# Patient Record
Sex: Female | Born: 1970 | Race: White | Hispanic: No | Marital: Married | Smoking: Former smoker
Health system: Southern US, Community
[De-identification: ages and names within clinical notes are randomized; demographics above are authoritative.]

## PROBLEM LIST (undated history)

## (undated) DIAGNOSIS — N920 Excessive and frequent menstruation with regular cycle: Secondary | ICD-10-CM

## (undated) DIAGNOSIS — T4145XA Adverse effect of unspecified anesthetic, initial encounter: Secondary | ICD-10-CM

## (undated) DIAGNOSIS — Z9889 Other specified postprocedural states: Secondary | ICD-10-CM

## (undated) DIAGNOSIS — Z319 Encounter for procreative management, unspecified: Secondary | ICD-10-CM

## (undated) DIAGNOSIS — I959 Hypotension, unspecified: Secondary | ICD-10-CM

## (undated) DIAGNOSIS — Z8739 Personal history of other diseases of the musculoskeletal system and connective tissue: Secondary | ICD-10-CM

## (undated) DIAGNOSIS — R112 Nausea with vomiting, unspecified: Secondary | ICD-10-CM

## (undated) DIAGNOSIS — I7300 Raynaud's syndrome without gangrene: Secondary | ICD-10-CM

## (undated) DIAGNOSIS — T8859XA Other complications of anesthesia, initial encounter: Secondary | ICD-10-CM

## (undated) DIAGNOSIS — R519 Headache, unspecified: Secondary | ICD-10-CM

## (undated) DIAGNOSIS — N949 Unspecified condition associated with female genital organs and menstrual cycle: Secondary | ICD-10-CM

## (undated) DIAGNOSIS — R51 Headache: Secondary | ICD-10-CM

## (undated) HISTORY — PX: ELBOW ARTHROPLASTY: SHX928

## (undated) HISTORY — DX: Encounter for procreative management, unspecified: Z31.9

## (undated) HISTORY — DX: Personal history of other diseases of the musculoskeletal system and connective tissue: Z87.39

## (undated) HISTORY — DX: Raynaud's syndrome without gangrene: I73.00

## (undated) HISTORY — DX: Hypotension, unspecified: I95.9

## (undated) HISTORY — PX: MANDIBLE FRACTURE SURGERY: SHX706

## (undated) HISTORY — DX: Excessive and frequent menstruation with regular cycle: N92.0

## (undated) HISTORY — PX: RECTOCELE REPAIR: SHX761

## (undated) HISTORY — PX: TUBAL LIGATION: SHX77

## (undated) HISTORY — DX: Unspecified condition associated with female genital organs and menstrual cycle: N94.9

---

## 2004-02-18 ENCOUNTER — Ambulatory Visit (HOSPITAL_COMMUNITY): Admission: RE | Admit: 2004-02-18 | Discharge: 2004-02-18 | Payer: Self-pay | Admitting: Obstetrics and Gynecology

## 2005-12-15 ENCOUNTER — Ambulatory Visit (HOSPITAL_COMMUNITY): Admission: RE | Admit: 2005-12-15 | Discharge: 2005-12-15 | Payer: Self-pay | Admitting: Family Medicine

## 2006-10-18 ENCOUNTER — Other Ambulatory Visit: Admission: RE | Admit: 2006-10-18 | Discharge: 2006-10-18 | Payer: Self-pay | Admitting: Obstetrics and Gynecology

## 2006-11-10 ENCOUNTER — Other Ambulatory Visit: Admission: RE | Admit: 2006-11-10 | Discharge: 2006-11-10 | Payer: Self-pay | Admitting: Obstetrics and Gynecology

## 2006-11-20 ENCOUNTER — Emergency Department (HOSPITAL_COMMUNITY): Admission: EM | Admit: 2006-11-20 | Discharge: 2006-11-20 | Payer: Self-pay | Admitting: Emergency Medicine

## 2008-03-27 ENCOUNTER — Emergency Department (HOSPITAL_COMMUNITY): Admission: EM | Admit: 2008-03-27 | Discharge: 2008-03-27 | Payer: Self-pay | Admitting: Emergency Medicine

## 2008-04-06 ENCOUNTER — Other Ambulatory Visit: Admission: RE | Admit: 2008-04-06 | Discharge: 2008-04-06 | Payer: Self-pay | Admitting: Obstetrics and Gynecology

## 2008-07-24 ENCOUNTER — Ambulatory Visit (HOSPITAL_BASED_OUTPATIENT_CLINIC_OR_DEPARTMENT_OTHER): Admission: RE | Admit: 2008-07-24 | Discharge: 2008-07-24 | Payer: Self-pay | Admitting: Orthopedic Surgery

## 2009-07-09 ENCOUNTER — Other Ambulatory Visit: Admission: RE | Admit: 2009-07-09 | Discharge: 2009-07-09 | Payer: Self-pay | Admitting: Obstetrics and Gynecology

## 2010-02-22 ENCOUNTER — Encounter: Payer: Self-pay | Admitting: Obstetrics and Gynecology

## 2010-02-23 ENCOUNTER — Encounter: Payer: Self-pay | Admitting: Family Medicine

## 2010-02-23 ENCOUNTER — Encounter: Payer: Self-pay | Admitting: Neurosurgery

## 2010-05-12 LAB — POCT HEMOGLOBIN-HEMACUE: Hemoglobin: 13.2 g/dL (ref 12.0–15.0)

## 2010-06-17 NOTE — Op Note (Signed)
NAMESABA, GOMM                ACCOUNT NO.:  [id number]   MEDICAL RECORD NO.:  [id number]          PATIENT TYPE:  AMB   LOCATION:  DSC                          FACILITY:  [facility]   PHYSICIAN:  Katy Fitch. Sypher, M.D. 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   OPERATIONS:  1. Debridement of necrotic extensor carpi radialis brevis tendon from      right lateral epicondyle.  2. Reconstruction of extensor carpi radialis longus and brevis origin      at lateral right elbow with through bone sutures following drilling      and decortication of lateral epicondyle.   OPERATING SURGEON:  Katy Fitch. Sypher, MD   ASSISTANT:  Marveen Reeks Dasnoit, PA-C   ANESTHESIA:  General by LMA.   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   She had sustained an injury to her elbow in February 2010.  She had  chronic elbow pain, unresponsive to anti-inflammatory medication and  prior injection.  She was referred for an upper extremity orthopedic  consult for chronic pain.  We recommended proceeding with an MRI to  determine whether or not she had cavitary tendinopathy.   The MRI revealed extensive lateral epicondylitis changes and cavity in  the extensor carpi radialis brevis.  Her lateral collateral ligament was  intact.  She also reported some tenderness in the mobile wide consistent  with possible radial tunnel syndrome.   In our experience, frequently there are signs of radial tunnel syndrome  when there is chronic  tendinopathy of the extensor carpi radialis  brevis.   We recommended proceeding with repair of the extensor carpi radialis  brevis and longus tendon origins prior to considering any treatment of  radial tunnel syndrome.   After informed consent, Mary Gilmore is brought to the operating room at  this time anticipating reconstruction of her right elbow common extensor  origin.   PROCEDURE:  Perrin Eddleman was brought to room #1 of [facility]  and placed in supine position on the operating table.   Under Dr. Michaelle Copas direct supervision, general anesthesia by LMA  technique was induced.  The right arm was prepped with Betadine soap and  solution, sterilely draped.  A 1 g of Ancef was administered as an IV  prophylactic antibiotic.  The right upper extremity was then  exsanguinated with an Esmarch bandage and the arterial tourniquet  inflated to 230 mmHg.  The procedure commenced with a short curvilinear  incision posterior to the epicondyle and posterior to the extensor carpi  radialis longus.  The interval between the anconeus and extensor carpi  radialis longus was sharply incised, the fascia elevated, and  the  tendinous origin inspected.  There was swelling of the extensor carpi  radialis longus and brevis.  The normal-appearing surface of the  extensor carpi radialis longus was incised and its fibers elevated.  The  extensor carpi radialis brevis origin deep and anterior was cavitary,  edematous, and had areas of tendon necrosis.  There was an area of  notching of the adjacent capitellum with synovitis, which could be  reactive due to prior injection or could simply be due to chronic  inflammation at the site of tendon injury.   The synovitis and area of necrotic tendon were debrided.  The extensor  carpi radialis longus and brevis were released at the level of the  brachioradialis.  The lateral condyle was then decorticated with a  rongeur and drilled approximately 30 times  with a 0.035-inch Kirschner  wire.  Mattress sutures were placed with through bone drill holes using  3-0 FiberWire.  This created anatomic inset of the extensor carpi  radialis longus and brevis tendons to the lateral condyle.  We used the  tails of the suture with over-the-top technique to pull the periosteum  laterally to meet the tendon followed by running suture finishing the  repair.  The fascia was then repaired with a running suture of 3-0  Vicryl.  An anatomic reconstruction of the tendon origin was achieved.   The wound was then repaired with intradermal 3-0 Prolene and Steri-  Strips.  A 2% lidocaine was infiltrated for postop analgesia.  Mary Gilmore  was placed in a compressive dressing at the elbow with sterile gauze and  Tegaderm followed by Ace wrap.  The wrist was immobilized in 40 degrees  of dorsiflexion with a volar plaster splint and Ace wrap.  There were no  apparent complications.      Katy Fitch Sypher, M.D.  Electronically Signed     RVS/MEDQ  D:  07/24/2008  T:  07/25/2008  Job:  [id number]

## 2010-09-16 ENCOUNTER — Other Ambulatory Visit (HOSPITAL_COMMUNITY)
Admission: RE | Admit: 2010-09-16 | Discharge: 2010-09-16 | Disposition: A | Discharge status: Home / Self Care | Payer: BC Managed Care – HMO | Source: Ambulatory Visit | Attending: Obstetrics and Gynecology | Admitting: Obstetrics and Gynecology

## 2010-09-16 DIAGNOSIS — Z01419 Encounter for gynecological examination (general) (routine) without abnormal findings: Secondary | ICD-10-CM | POA: Insufficient documentation

## 2010-11-12 LAB — URINE CULTURE
Colony Count: NO GROWTH
Culture: NO GROWTH

## 2010-11-12 LAB — COMPREHENSIVE METABOLIC PANEL
ALT: 17
AST: 20
Albumin: 3.8
Alkaline Phosphatase: 60
BUN: 5 — ABNORMAL LOW
CO2: 25
Calcium: 9
Chloride: 105
Creatinine, Ser: 0.83
GFR calc Af Amer: >60
GFR calc non Af Amer: >60
Glucose, Bld: 98
Potassium: 3.7
Sodium: 136
Total Bilirubin: 0.8
Total Protein: 6.5

## 2010-11-12 LAB — URINALYSIS, ROUTINE W REFLEX MICROSCOPIC
Bilirubin Urine: NEGATIVE
Glucose, UA: NEGATIVE
Hgb urine dipstick: NEGATIVE
Ketones, ur: NEGATIVE
Nitrite: NEGATIVE
Protein, ur: NEGATIVE
Specific Gravity, Urine: <1.005 — ABNORMAL LOW
Urobilinogen, UA: 0.2
pH: 6

## 2010-11-12 LAB — CBC
HCT: 38.7
Hemoglobin: 13.1
MCHC: 33.9
MCV: 95.7
Platelets: 211
RBC: 4.04
RDW: 13.1
WBC: 12.1 — ABNORMAL HIGH

## 2010-11-12 LAB — DIFFERENTIAL
Basophils Absolute: 0
Basophils Relative: 0
Eosinophils Absolute: 0.3
Eosinophils Relative: 2
Lymphocytes Relative: 22
Lymphs Abs: 2.7
Monocytes Absolute: 1.1 — ABNORMAL HIGH
Monocytes Relative: 9
Neutro Abs: 8 — ABNORMAL HIGH
Neutrophils Relative %: 66

## 2012-06-06 HISTORY — PX: OTHER SURGICAL HISTORY: SHX169

## 2012-07-01 ENCOUNTER — Ambulatory Visit (INDEPENDENT_AMBULATORY_CARE_PROVIDER_SITE_OTHER): Payer: BC Managed Care – PPO | Admitting: Adult Health

## 2012-07-01 ENCOUNTER — Encounter: Payer: Self-pay | Admitting: Adult Health

## 2012-07-01 VITALS — BP 108/64 | Ht 62.5 in | Wt 156.0 lb

## 2012-07-01 DIAGNOSIS — N949 Unspecified condition associated with female genital organs and menstrual cycle: Secondary | ICD-10-CM

## 2012-07-01 DIAGNOSIS — N898 Other specified noninflammatory disorders of vagina: Secondary | ICD-10-CM

## 2012-07-01 HISTORY — DX: Unspecified condition associated with female genital organs and menstrual cycle: N94.9

## 2012-07-01 LAB — POCT WET PREP (WET MOUNT)
Bacteria Wet Prep HPF POC: NEGATIVE
WBC, Wet Prep HPF POC: NEGATIVE

## 2012-07-01 LAB — POCT URINE PREGNANCY: Preg Test, Ur: NEGATIVE

## 2012-07-01 MED ORDER — CIPROFLOXACIN HCL 500 MG PO TABS: 500.0000 mg | ORAL_TABLET | Freq: Two times a day (BID) | ORAL | Status: DC

## 2012-07-01 NOTE — Patient Instructions (Addendum)
Follow up 1 week Call prn problems

## 2012-07-01 NOTE — Progress Notes (Signed)
Subjective:     Patient ID: Mary Gilmore, female   DOB: 01-12-1971, 42 y.o.   MRN: [id number]  HPI Mary Gilmore is a 42 year old white female who had a tubal reversal 06/06/12 and has been released, she had sex 2 days ago and has some pain left side since right before the sex.Some pain in back and legs, too She checked her urine at work and it was negative.  Review of Systems Patient denies any headaches, blurred vision, shortness of breath, chest pain, problems with bowel movements, urination, or intercourse. Positives as above.   Reviewed past medical,surgical, social and family history. Reviewed medications and allergies.  Objective:   Physical Exam Blood pressure 108/64, height 5' 2.5" (1.588 m), weight 156 lb (70.761 kg), last menstrual period 06/16/2012.urine pregnancy test negative. Skin warm and dry.Pelvic: external genitalia is normal in appearance, vagina: white discharge without odor, cervix:smooth and bulbous, negative CMT, uterus: normal size, shape and contour, non tender, no masses felt, adnexa: no masses or tenderness noted. Wet prep: negative.She does have suprapubic tenderness, discussed with Dr. Despina Hidden.   Her incision is healed, edges together without redness and has a stitch showing left edge. Assessment:    Pelvic Pain  Supra pubic tenderness    Plan:     Rx cipro 500 mg 1 bid x 7 days No sex  Increase fluids Return in follow up 1 week or before if needed

## 2012-07-05 ENCOUNTER — Telehealth: Payer: Self-pay | Admitting: Adult Health

## 2012-07-05 NOTE — Telephone Encounter (Signed)
Left message to call me back.

## 2012-07-05 NOTE — Telephone Encounter (Signed)
Pt called back and wants diet aid, told her to make appt.

## 2012-07-08 ENCOUNTER — Ambulatory Visit: Payer: BC Managed Care – PPO | Admitting: Adult Health

## 2012-07-13 ENCOUNTER — Encounter: Payer: Self-pay | Admitting: *Deleted

## 2012-07-14 ENCOUNTER — Ambulatory Visit: Payer: BC Managed Care – PPO | Admitting: Adult Health

## 2012-11-16 ENCOUNTER — Ambulatory Visit: Payer: BC Managed Care – PPO | Admitting: Adult Health

## 2013-04-25 ENCOUNTER — Encounter (INDEPENDENT_AMBULATORY_CARE_PROVIDER_SITE_OTHER): Payer: Self-pay

## 2013-04-25 ENCOUNTER — Ambulatory Visit (INDEPENDENT_AMBULATORY_CARE_PROVIDER_SITE_OTHER): Payer: BC Managed Care – PPO | Admitting: Obstetrics and Gynecology

## 2013-04-25 ENCOUNTER — Encounter: Payer: Self-pay | Admitting: Obstetrics and Gynecology

## 2013-04-25 VITALS — BP 110/68 | Ht 62.5 in | Wt 158.8 lb

## 2013-04-25 DIAGNOSIS — IMO0002 Reserved for concepts with insufficient information to code with codable children: Secondary | ICD-10-CM

## 2013-04-25 DIAGNOSIS — N979 Female infertility, unspecified: Secondary | ICD-10-CM

## 2013-04-25 NOTE — Progress Notes (Signed)
Patient ID: Mary MoriSherry Vacha, female   DOB: September 01, 1970, 43 y.o.   MRN: [id number]   [facility]  Patient name: Mary Gilmore MRN [id number]  Date of birth: September 01, 1970  CC & HPI:  Mary Gilmore is a 43 y.o. female presenting today for followup of labs after [facility]mulvaney[facility]. 2014, may 5, and pt states menses are MUCH less painful and  Less sense of cramps and discomfort. Pt has checked labs, 11/09/12  Had FsH on menstrual day 1  Of 4.7                                      04/25/13, fsh &LH on menstrual day  7 of 5.2, with LH 6.4 Menses are heavy, x first 2 days, then light x 5 d.   ROS:  Husband has high SA.  Pertinent History Reviewed:  Medical & Surgical Hx:  Reviewed: Significant for no med complaints. Medications: Reviewed & Updated - see associated section Social History: Reviewed -  reports that she has quit smoking. She has never used smokeless tobacco.  Objective Findings:  Vitals: BP 110/68  Ht 5' 2.5" (1.588 m)  Wt 72.031 kg (158 lb 12.8 oz)  BMI 28.56 kg/m2  LMP 04/20/2013  Physical Examination: General appearance - alert, well appearing, and in no distress, oriented to person, place, and time and normal appearing weight Mental status - alert, oriented to person, place, and time, normal mood, behavior, speech, dress, motor activity, and thought processes   Assessment & Plan:   Secondary infertility S/p tubal reversal,2014 Has not had post-procedure dye studies.  Plan : review labs           Check, progesterone levels day 21           Consider HSG,

## 2013-04-25 NOTE — Patient Instructions (Signed)
Call us when next period starts so we can scheldule HSG with next menses.

## 2013-04-28 ENCOUNTER — Telehealth: Payer: Self-pay

## 2013-05-02 ENCOUNTER — Telehealth: Payer: Self-pay | Admitting: Obstetrics and Gynecology

## 2013-05-02 NOTE — Telephone Encounter (Signed)
Pt states Dr. Emelda FearFerguson was to review labs she brought in at last appt and contact pt to discuss.

## 2013-05-02 NOTE — Telephone Encounter (Signed)
Done

## 2013-05-08 ENCOUNTER — Telehealth: Payer: Self-pay | Admitting: Obstetrics and Gynecology

## 2013-05-08 NOTE — Telephone Encounter (Signed)
Pt states discussed labs and fertility with Dr. Emelda FearFerguson at her last appt. Dr. Emelda FearFerguson was to review labs and discuss with pt.

## 2013-05-16 NOTE — Telephone Encounter (Signed)
Unable to reach pt at home or through [facility] number, [phone]. Pt has not had a post-procedure HSG Also needs progesterone level. LMP was 19 march now day 26. Too late this month for progest level. Pt noting llq pain, in area of "tubes." Pt requesting HSG after next menses and WANTS COST INFORMATION, MAY NEED TO CONSIDER SONOHYSTEROGRAM IN OFFICE TO TRY TO ASSESS TUBAL PATENCY, FOR COST CONSIDERATIONS.

## 2013-05-19 ENCOUNTER — Telehealth: Payer: Self-pay | Admitting: *Deleted

## 2013-05-19 NOTE — Telephone Encounter (Signed)
Pt states Dr. Emelda FearFerguson requested pt call office when she started her period which started today, so he could do labs for estrogen level and schedule an appt for ultrasound.

## 2013-05-22 ENCOUNTER — Telehealth: Payer: Self-pay | Admitting: Obstetrics and Gynecology

## 2013-05-22 ENCOUNTER — Ambulatory Visit: Payer: BC Managed Care – PPO | Admitting: Obstetrics and Gynecology

## 2013-05-22 ENCOUNTER — Other Ambulatory Visit: Payer: BC Managed Care – PPO

## 2013-05-22 NOTE — Telephone Encounter (Signed)
Spoke with pt. Pt started period on Friday. Dr. Emelda FearFerguson advised to do a HSG on next period. Pt should end period tomorrow. Pt stated she was having some pain in left side. Pt needs procedure either today or tomorrow. Call transferred to front desk for appt. JSY

## 2013-06-01 ENCOUNTER — Telehealth: Payer: Self-pay | Admitting: *Deleted

## 2013-06-01 NOTE — Telephone Encounter (Signed)
Pt has several questions concerning HSG procedure in our office. Pt aware Dr. Emelda FearFerguson out of office today will be back in tomorrow.

## 2013-06-15 ENCOUNTER — Telehealth: Payer: Self-pay | Admitting: Obstetrics and Gynecology

## 2013-06-15 NOTE — Telephone Encounter (Signed)
Pt to fax results to our office for Dr. Emelda FearFerguson to review. JSY

## 2013-06-27 ENCOUNTER — Telehealth: Payer: Self-pay | Admitting: Obstetrics and Gynecology

## 2013-06-28 NOTE — Telephone Encounter (Signed)
Pt states Dr. Emelda Fear to review labs and discuss with patient

## 2013-07-03 ENCOUNTER — Other Ambulatory Visit: Payer: Self-pay | Admitting: Adult Health

## 2013-07-03 ENCOUNTER — Ambulatory Visit (INDEPENDENT_AMBULATORY_CARE_PROVIDER_SITE_OTHER): Payer: BC Managed Care – PPO

## 2013-07-03 ENCOUNTER — Ambulatory Visit (INDEPENDENT_AMBULATORY_CARE_PROVIDER_SITE_OTHER): Payer: BC Managed Care – PPO | Admitting: Adult Health

## 2013-07-03 ENCOUNTER — Encounter: Payer: Self-pay | Admitting: Adult Health

## 2013-07-03 VITALS — BP 112/64 | Ht 62.0 in | Wt 152.0 lb

## 2013-07-03 DIAGNOSIS — N920 Excessive and frequent menstruation with regular cycle: Secondary | ICD-10-CM

## 2013-07-03 DIAGNOSIS — Z319 Encounter for procreative management, unspecified: Secondary | ICD-10-CM

## 2013-07-03 HISTORY — DX: Encounter for procreative management, unspecified: Z31.9

## 2013-07-03 HISTORY — DX: Excessive and frequent menstruation with regular cycle: N92.0

## 2013-07-03 LAB — POCT URINE PREGNANCY: Preg Test, Ur: NEGATIVE

## 2013-07-03 MED ORDER — PRENATAL PLUS 27-1 MG PO TABS: 1.0000 | ORAL_TABLET | Freq: Every day | ORAL | Status: DC

## 2013-07-03 NOTE — Progress Notes (Signed)
Subjective:     Patient ID: Mary Gilmore, female   DOB: [birth year], 43 y.o.   MRN: [id number]  HPI Mary Gilmore is a 43 year old white female, married trying to get pregnant after tubal reversal, periods had been normal til last one. Had period 5/17 x 5 days then spotted Friday after sex was brown and had some burning in vagina then started bright red bleeding Saturday and then Sunday had pain radiating down left leg and was light headed and teary.  Review of Systems See HPI Reviewed past medical,surgical, social and family history. Reviewed medications and allergies.     Objective:   Physical Exam BP 112/64  Ht 5\' 2"  (1.575 m)  Wt 152 lb (68.947 kg)  BMI 27.79 kg/m2  LMP 05/17/2015UPT ngative, Skin warm and dry.Pelvic: external genitalia is normal in appearance, vagina: period like blood  without odor, cervix:smooth and bulbous, uterus: normal size, shape and contour, non tender, no masses felt, adnexa: no masses or tenderness noted.Got SF:KCLEXN 10.3 x 5.4 x 4.3 cm, no myometrial masses noted  Endometrium 8.2 mm, symmetrical,  Right ovary 3.9 x 2.4 x 1.6 cm, (seen best abdominally, located at top of uterine fundus by u/s)  Left ovary 4.1 x 2.8 x 2.5 cm, 2.0 follicle noted  No free fluid or adnexal masses noted within the pelvis  Technician Comments:  Anteverted uterus noted, Endom-8.2mm, bilateral adnexa appears WNL, no free fluid or adnexal masses noted within the pelvis  Reviewed [place] with pt      Assessment:     Menorrhagia   Desires pregnancy  Plan:    Order given to check labs at her work for [employer] and TSH Rx prenatal plus #30 1 daily with 11 refills   Return in 1 week to talk with Dr Emelda Fear about getting pregnant

## 2013-07-03 NOTE — Patient Instructions (Addendum)
Menorrhagia Menorrhagia is the medical term for when your menstrual periods are heavy or last longer than usual. With menorrhagia, every period you have may cause enough blood loss and cramping that you are unable to maintain your usual activities. CAUSES  In some cases, the cause of heavy periods is unknown, but a number of conditions may cause menorrhagia. Common causes include:  A problem with the hormone-producing thyroid gland (hypothyroid).  Noncancerous growths in the uterus (polyps or fibroids).  An imbalance of the estrogen and progesterone hormones.  One of your ovaries not releasing an egg during one or more months.  Side effects of having an intrauterine device (IUD).  Side effects of some medicines, such as anti-inflammatory medicines or blood thinners.  A bleeding disorder that stops your blood from clotting normally. SIGNS AND SYMPTOMS  During a normal period, bleeding lasts between 4 and 8 days. Signs that your periods are too heavy include:  You routinely have to change your pad or tampon every 1 or 2 hours because it is completely soaked.  You pass blood clots larger than 1 inch (2.5 cm) in size.  You have bleeding for more than 7 days.  You need to use pads and tampons at the same time because of heavy bleeding.  You need to wake up to change your pads or tampons during the night.  You have symptoms of anemia, such as tiredness, fatigue, or shortness of breath. DIAGNOSIS  Your health care provider will perform a physical exam and ask you questions about your symptoms and menstrual history. Other tests may be ordered based on what the health care provider finds during the exam. These tests can include:  Blood tests To check if you are pregnant or have hormonal changes, a bleeding or thyroid disorder, low iron levels (anemia), or other problems.  Endometrial biopsy Your health care provider takes a sample of tissue from the inside of your uterus to be examined  under a microscope.  Pelvic ultrasound This test uses sound waves to make a picture of your uterus, ovaries, and vagina. The pictures can show if you have fibroids or other growths.  Hysteroscopy For this test, your health care provider will use a small telescope to look inside your uterus. Based on the results of your initial tests, your health care provider may recommend further testing. TREATMENT  Treatment may not be needed. If it is needed, your health care provider may recommend treatment with one or more medicines first. If these do not reduce bleeding enough, a surgical treatment might be an option. The best treatment for you will depend on:   Whether you need to prevent pregnancy.  Your desire to have children in the future.  The cause and severity of your bleeding.  Your opinion and personal preference.  Medicines for menorrhagia may include:  Birth control methods that use hormones These include the pill, skin patch, vaginal ring, shots that you get every 3 months, hormonal IUD, and implant. These treatments reduce bleeding during your menstrual period.  Medicines that thicken blood and slow bleeding.  Medicines that reduce swelling, such as ibuprofen.  Medicines that contain a synthetic hormone called progestin.   Medicines that make the ovaries stop working for a short time.  You may need surgical treatment for menorrhagia if the medicines are unsuccessful. Treatment options include:  Dilation and curettage (D&C) In this procedure, your health care provider opens (dilates) your cervix and then scrapes or suctions tissue from the lining of your  uterus to reduce menstrual bleeding.  Operative hysteroscopy This procedure uses a tiny tube with a light (hysteroscope) to view your uterine cavity and can help in the surgical removal of a polyp that may be causing heavy periods.  Endometrial ablation Through various techniques, your health care provider permanently  destroys the entire lining of your uterus (endometrium). After endometrial ablation, most women have little or no menstrual flow. Endometrial ablation reduces your ability to become pregnant.  Endometrial resection This surgical procedure uses an electrosurgical wire loop to remove the lining of the uterus. This procedure also reduces your ability to become pregnant.  Hysterectomy Surgical removal of the uterus and cervix is a permanent procedure that stops menstrual periods. Pregnancy is not possible after a hysterectomy. This procedure requires anesthesia and hospitalization. HOME CARE INSTRUCTIONS   Only take over-the-counter or prescription medicines as directed by your health care provider. Take prescribed medicines exactly as directed. Do not change or switch medicines without consulting your health care provider.  Take any prescribed iron pills exactly as directed by your health care provider. Long-term heavy bleeding may result in low iron levels. Iron pills help replace the iron your body lost from heavy bleeding. Iron may cause constipation. If this becomes a problem, increase the bran, fruits, and roughage in your diet.  Do not take aspirin or medicines that contain aspirin 1 week before or during your menstrual period. Aspirin may make the bleeding worse.  If you need to change your sanitary pad or tampon more than once every 2 hours, stay in bed and rest as much as possible until the bleeding stops.  Eat well-balanced meals. Eat foods high in iron. Examples are leafy green vegetables, meat, liver, eggs, and whole grain breads and cereals. Do not try to lose weight until the abnormal bleeding has stopped and your blood iron level is back to normal. SEEK MEDICAL CARE IF:   You soak through a pad or tampon every 1 or 2 hours, and this happens every time you have a period.  You need to use pads and tampons at the same time because you are bleeding so much.  You need to change your pad  or tampon during the night.  You have a period that lasts for more than 8 days.  You pass clots bigger than 1 inch wide.  You have irregular periods that happen more or less often than once a month.  You feel dizzy or faint.  You feel very weak or tired.  You feel short of breath or feel your heart is beating too fast when you exercise.  You have nausea and vomiting or diarrhea while you are taking your medicine.  You have any problems that may be related to the medicine you are taking. SEEK IMMEDIATE MEDICAL CARE IF:   You soak through 4 or more pads or tampons in 2 hours.  You have any bleeding while you are pregnant. MAKE SURE YOU:   Understand these instructions.  Will watch your condition.  Will get help right away if you are not doing well or get worse. Document Released: 01/19/2005 Document Revised: 11/09/2012 Document Reviewed: 07/10/2012 [facility] Patient Information 2014 [place], [place]. Return in 1 week with Dr Emelda Fear

## 2013-07-04 NOTE — Telephone Encounter (Signed)
Pt referred to Dr Deaton of [facility] 

## 2013-07-04 NOTE — Telephone Encounter (Signed)
Pt contacted by phone on 07/04/13, regarding her fertility questions. I have recommended that she work with a Psychologist, prison and probation services, given her age and history of Tubal reversal.  Pt is familiar with [facility], Dr Elesa Hacker, and will recontact his office regarding followup, and costs of evaluation and treatments.

## 2013-07-04 NOTE — Telephone Encounter (Signed)
Pt referred to Dr Elesa Hacker of [facility]

## 2013-07-10 ENCOUNTER — Ambulatory Visit: Payer: BC Managed Care – PPO | Admitting: Obstetrics and Gynecology

## 2013-07-27 ENCOUNTER — Telehealth: Payer: Self-pay | Admitting: Adult Health

## 2013-07-27 NOTE — Telephone Encounter (Signed)
Go see fertility doctor and then let us know what he says

## 2013-12-04 ENCOUNTER — Encounter: Payer: Self-pay | Admitting: Adult Health

## 2014-01-05 ENCOUNTER — Telehealth: Payer: Self-pay | Admitting: Adult Health

## 2014-01-05 NOTE — Telephone Encounter (Signed)
Pt having some pink to red discharge before her period for last 2 months, no pain or odor, if wants it checked make appt, but sounds OK, she has seen [facility] in past would like another child

## 2014-01-16 ENCOUNTER — Telehealth: Payer: Self-pay | Admitting: *Deleted

## 2014-01-16 NOTE — Telephone Encounter (Signed)
Mary Gilmore just wanted to make sure that Dr.Fergsuon had sent the referral to the fertility specialist. When looking back at the Mary Gilmore's chart it looks like Dr. Emelda FearFerguson sent a referral in June for the Mary Gilmore. Mary Gilmore has an appointment next week for the fertility specialist.

## 2014-06-05 ENCOUNTER — Encounter: Payer: Self-pay | Admitting: Obstetrics and Gynecology

## 2014-06-05 ENCOUNTER — Ambulatory Visit (INDEPENDENT_AMBULATORY_CARE_PROVIDER_SITE_OTHER): Payer: Managed Care, Other (non HMO) | Admitting: Obstetrics and Gynecology

## 2014-06-05 ENCOUNTER — Other Ambulatory Visit (HOSPITAL_COMMUNITY)
Admission: RE | Admit: 2014-06-05 | Discharge: 2014-06-05 | Disposition: A | Discharge status: Home / Self Care | Payer: Managed Care, Other (non HMO) | Source: Ambulatory Visit | Attending: Obstetrics and Gynecology | Admitting: Obstetrics and Gynecology

## 2014-06-05 ENCOUNTER — Encounter: Payer: Self-pay | Admitting: *Deleted

## 2014-06-05 VITALS — BP 128/76 | Ht 63.0 in | Wt 162.0 lb

## 2014-06-05 DIAGNOSIS — Z01419 Encounter for gynecological examination (general) (routine) without abnormal findings: Secondary | ICD-10-CM

## 2014-06-05 DIAGNOSIS — Z1151 Encounter for screening for human papillomavirus (HPV): Secondary | ICD-10-CM | POA: Diagnosis present

## 2014-06-05 MED ORDER — NORETHINDRONE 0.35 MG PO TABS: 1.0000 | ORAL_TABLET | Freq: Every day | ORAL | Status: DC

## 2014-06-05 NOTE — Progress Notes (Signed)
Patient ID: Mary MoriSherry Janicki, female   DOB: 1970-11-01, 44 y.o.   MRN: [id number] Pt here today for annual exam. Pt states that she wants Dr. Emelda FearFerguson to check her hormones. Pt states that her periods are awful. Menses are regular, but heavy and painful.  Pt having some mild anemia. Last pap 2010 normal. Pt concerned due to family hx with multiple women requiring hyst , mom had ALS and died recently Assessment:  Annual Gyn Exam  perimenstrual molimna  Plan:  1. pap smear done, next pap due 3 yr. 2. return annually or prn 3    Annual mammogram advised 4. Trial progesterone only pills s 2 smonths. Subjective:  Mary Gilmore is a 44 y.o. female [id number] who presents for annual exam. Patient's last menstrual period was 05/15/2014. The patient has complaints today of normal 5 d period , q28-30d , but premenstrual leg ache, fatigue, and exhaustion. Lots of leg ache into thighs to knee, both before and after menses. Nonsmoker.  The following portions of the patient's history were reviewed and updated as appropriate: allergies, current medications, past family history, past medical history, past social history, past surgical history and problem list. Past Medical History  Diagnosis Date  . Unspecified symptom associated with female genital organs 07/01/2012  . Hypotension   . Menorrhagia 07/03/2013  . Patient desires pregnancy 07/03/2013    Past Surgical History  Procedure Laterality Date  . Tubal ligation    . Tubal reversal  06/06/2012  . Mandible fracture surgery    . Rectocele repair     Reversal 2014. May.  Current outpatient prescriptions:  .  cholecalciferol (VITAMIN D) 1000 UNITS tablet, Take 10,000 Units by mouth daily., Disp: , Rfl:  .  Probiotic Product (PROBIOTIC DAILY PO), Take by mouth., Disp: , Rfl:   Review of Systems Constitutional: negative Gastrointestinal: negative Genitourinary: s/p reversal Dr Elesa Hackereaton  $10K.  Objective:  BP 128/76 mmHg  Ht 5\' 3"  (1.6 m)  Wt 162 lb  (73.483 kg)  BMI 28.70 kg/m2  LMP 05/15/2014   BMI: Body mass index is 28.7 kg/(m^2).  General Appearance: Alert, appropriate appearance for age. No acute distress HEENT: Grossly normal Neck / Thyroid:  Cardiovascular: RRR; normal S1, S2, no murmur Lungs: CTA bilaterally Back: No CVAT Breast Exam: No dimpling, nipple retraction or discharge. No masses or nodes. and No masses or nodes.No dimpling, nipple retraction or discharge. Gastrointestinal: Soft, non-tender, no masses or organomegaly Pelvic Exam: Vulva and vagina appear normal. Bimanual exam reveals normal uterus and adnexa. Vaginal: normal mucosa without prolapse or lesions Cervix: normal appearance Adnexa: normal bimanual exam Uterus: anteverted Rectovaginal: not indicated Lymphatic Exam: Non-palpable nodes in neck, clavicular, axillary, or inguinal regions Skin: no rash or abnormalities Neurologic: Normal gait and speech, no tremor  Psychiatric: Alert and oriented, appropriate affect.  Urinalysis:  Christin BachJohn Emary Zalar. MD Pgr [phone] 3:03 PM

## 2014-06-07 ENCOUNTER — Telehealth: Payer: Self-pay | Admitting: Obstetrics and Gynecology

## 2014-06-07 NOTE — Telephone Encounter (Signed)
Pt states that she has looked up the progesterone pill. Pt wants to know if there is something natural that she could take instead? Pt informed that Dr. Emelda FearFerguson is out of the office today and will be back tomorrow, pt also informed that I would send this message to him. Pt verbalized understanding.

## 2014-06-08 LAB — CYTOLOGY - PAP

## 2014-06-12 ENCOUNTER — Telehealth: Payer: Self-pay | Admitting: Obstetrics and Gynecology

## 2014-06-12 NOTE — Telephone Encounter (Signed)
Pt aware of pap results.

## 2014-06-15 NOTE — Telephone Encounter (Signed)
Left message that if pt has concerns re: progesterone, will be happy to see pt and have her bring her list of info and concerns for my review.

## 2015-03-27 ENCOUNTER — Telehealth: Payer: Self-pay | Admitting: Adult Health

## 2015-03-27 NOTE — Telephone Encounter (Signed)
Having periods every 2 weeks, had CBC was normal, skin changes, like cystic acne, make appt to see Dr Emelda Fear

## 2015-04-01 ENCOUNTER — Ambulatory Visit (INDEPENDENT_AMBULATORY_CARE_PROVIDER_SITE_OTHER): Payer: Managed Care, Other (non HMO) | Admitting: Obstetrics and Gynecology

## 2015-04-01 ENCOUNTER — Other Ambulatory Visit (INDEPENDENT_AMBULATORY_CARE_PROVIDER_SITE_OTHER): Payer: Managed Care, Other (non HMO)

## 2015-04-01 ENCOUNTER — Encounter: Payer: Self-pay | Admitting: Obstetrics and Gynecology

## 2015-04-01 ENCOUNTER — Other Ambulatory Visit: Payer: Self-pay | Admitting: Obstetrics and Gynecology

## 2015-04-01 VITALS — BP 110/70 | Ht 63.0 in | Wt 172.0 lb

## 2015-04-01 DIAGNOSIS — N921 Excessive and frequent menstruation with irregular cycle: Secondary | ICD-10-CM

## 2015-04-01 DIAGNOSIS — N854 Malposition of uterus: Secondary | ICD-10-CM

## 2015-04-01 NOTE — Progress Notes (Unsigned)
PELVIC US TA/TV: homogenous anteverted uterus,complex thickened endometrium 18.86mm,complex rt ov cyst 3.3 x 2.8 x 2.6 cm,normal lt ov,ov's appear to be mobile,no free fluid seen,images were review w/Dr Emelda Fear.

## 2015-04-01 NOTE — Progress Notes (Signed)
[facility] Visit  Patient name: Mary Gilmore MRN [id number]  Date of birth: [birth year]  CC & HPI:  Mary Gilmore is a 45 y.o. female with a history of tubal reversal done 3 years ago, presenting today for chronic menometrorrhagia that has been ongoing for 8 months. She reports having 2 menstrual periods a month, lasting about 7 days at a time, about 13-14 days apart. She also complains of premenstrual shakiness and generalized that continues into the period. She states she will get a day of relief when her period ends, but she will continue to have premenstrual weakness again preceding her next period. Patient states her periods leave her "absolutely wiped out" and are "unbearable." She states she wants to "get rid of the weakness and the pain." Patient's LMP was 03/29/15-present.   Patient was last seen by me on 06/05/14, and at that time was started on a trial of Micronor. She was scheduled for a 33-month follow-up, but shortly thereafter had a PCP appointment with Dr. Sharma Covert, at which point she was recommended holistic progesterone replacement therapy. She stopped using the Micronor as soon as she started a holistic progesterone replacement therapy that was compounded by pharmacists. Patient states she has had ultrasounds of her upper abdomen, colonoscopies, and endoscopies.   PCP: Dr. Sharma Covert at [facility]  ROS:  A complete review of systems was obtained and all systems are negative except as noted in the HPI and PMH.    Pertinent History Reviewed:   Reviewed: Significant for rectocele repair, tubal ligation, and tubal reversal Medical         Past Medical History  Diagnosis Date  . Unspecified symptom associated with female genital organs 07/01/2012  . Hypotension   . Menorrhagia 07/03/2013  . Patient desires pregnancy 07/03/2013                              Surgical Hx:    Past Surgical History  Procedure Laterality Date  . Tubal ligation    . Tubal reversal  06/06/2012  .  Mandible fracture surgery    . Rectocele repair     Medications: Reviewed & Updated - see associated section                       Current outpatient prescriptions:  .  cholecalciferol (VITAMIN D) 1000 UNITS tablet, Take 2,000 Units by mouth daily. , Disp: , Rfl:  .  Vitamin D, Ergocalciferol, (DRISDOL) 50000 units CAPS capsule, Take 50,000 Units by mouth every 30 (thirty) days., Disp: , Rfl:  .  norethindrone (MICRONOR,CAMILA,ERRIN) 0.35 MG tablet, Take 1 tablet (0.35 mg total) by mouth daily. (Patient not taking: Reported on 04/01/2015), Disp: 1 Package, Rfl: 11   Social History: Reviewed -  reports that she has quit smoking. She has never used smokeless tobacco.  Objective Findings:  Vitals: Blood pressure 110/70, height  (1.6 m), weight 172 lb (78.019 kg), last menstrual period 03/29/2015.  Physical Examination: General appearance - alert, well appearing, and in no distress, oriented to person, place, and time and overweight Mental status - alert, oriented to person, place, and time, normal mood, behavior, speech, dress, motor activity, and thought processes, affect appropriate to mood Pelvic -  UTERUS: well-supported, anteverted, upper limits normal size. Heavy menstrual flow. Moderate to significant tenderness to uterine contact.   Assessment & Plan:   A:  1. Menorrhagia, s/p tubal reversal,  no longer seeking pregnancy 2 irregular thickened 18 mm endometrial stripe on u/s , will need sonohysterogram and probable endo biopsy or polypectomy P:  1. Pelvic u/s = done Scheduled for Sonohysterogram   By signing my name below, I, Ronney Lion, attest that this documentation has been prepared under the direction and in the presence of [name], MD. Electronically Signed: Ronney Lion, ED Scribe. 04/01/2015. 3:08 PM.  I personally performed the services described in this documentation, which was SCRIBED in my presence. The recorded information has been reviewed and considered  accurate. It has been edited as necessary during review. [name], MD

## 2015-04-01 NOTE — Progress Notes (Signed)
Patient ID: Mary Gilmore, female   DOB: 02-14-1970, 45 y.o.   MRN: [id number] Pt here today for irregular bleeding. Pt states that she has more than one period a month. Pt states that she has had this problem for about 8 months.

## 2015-04-03 ENCOUNTER — Other Ambulatory Visit: Payer: Self-pay | Admitting: Obstetrics and Gynecology

## 2015-04-03 ENCOUNTER — Telehealth: Payer: Self-pay | Admitting: *Deleted

## 2015-04-03 DIAGNOSIS — N920 Excessive and frequent menstruation with regular cycle: Secondary | ICD-10-CM

## 2015-04-03 MED ORDER — MEGESTROL ACETATE 40 MG PO TABS: 40.0000 mg | ORAL_TABLET | Freq: Three times a day (TID) | ORAL | Status: DC

## 2015-04-03 NOTE — Telephone Encounter (Signed)
Pt states that she is bleeding heavy and she feels awful. Pt states that she is having to change her pad every hour. Pt wants to know what can be done.

## 2015-04-04 NOTE — Progress Notes (Signed)
LMOM for pt to check with her pharmacy. Call back if there are any questions.

## 2015-04-05 NOTE — Telephone Encounter (Signed)
Patient called re heavier bleeding, I left message for pt to call back this weekend at [phone] if needed. Pt is on megace per last office visit.

## 2015-04-11 ENCOUNTER — Other Ambulatory Visit: Payer: Self-pay | Admitting: Obstetrics and Gynecology

## 2015-04-11 DIAGNOSIS — N921 Excessive and frequent menstruation with irregular cycle: Secondary | ICD-10-CM

## 2015-04-15 ENCOUNTER — Other Ambulatory Visit: Payer: Self-pay | Admitting: Obstetrics and Gynecology

## 2015-04-15 ENCOUNTER — Ambulatory Visit (INDEPENDENT_AMBULATORY_CARE_PROVIDER_SITE_OTHER): Payer: Managed Care, Other (non HMO) | Admitting: Obstetrics and Gynecology

## 2015-04-15 ENCOUNTER — Encounter: Payer: Self-pay | Admitting: *Deleted

## 2015-04-15 ENCOUNTER — Ambulatory Visit (INDEPENDENT_AMBULATORY_CARE_PROVIDER_SITE_OTHER): Payer: Managed Care, Other (non HMO)

## 2015-04-15 DIAGNOSIS — R938 Abnormal findings on diagnostic imaging of other specified body structures: Secondary | ICD-10-CM | POA: Diagnosis not present

## 2015-04-15 DIAGNOSIS — R9389 Abnormal findings on diagnostic imaging of other specified body structures: Secondary | ICD-10-CM

## 2015-04-15 DIAGNOSIS — N921 Excessive and frequent menstruation with irregular cycle: Secondary | ICD-10-CM | POA: Diagnosis not present

## 2015-04-15 NOTE — Progress Notes (Signed)
[facility] Visit  Patient name: Mary Gilmore MRN [id number]  Date of birth: [birth year]  CC & HPI:  Mary Gilmore is a 45 y.o. female presenting today for a sonohysterogram and endometrial biopsy due to an irregular thickened 18 mm endometrial stripe on u/s seen 2 weeks ago, on 04/01/15. Patient had been seen for menorrhagia s/p tubal reversal (no longer seeking pregnancy), per medical records.    ROS:  A complete review of systems was obtained and all systems are negative except as noted in the HPI and PMH.    Pertinent History Reviewed:   Reviewed: Significant for menorrhagia, tubal ligation, tubal reversal, and rectocele repair Medical         Past Medical History  Diagnosis Date  . Unspecified symptom associated with female genital organs 07/01/2012  . Hypotension   . Menorrhagia 07/03/2013  . Patient desires pregnancy 07/03/2013                              Surgical Hx:    Past Surgical History  Procedure Laterality Date  . Tubal ligation    . Tubal reversal  06/06/2012  . Mandible fracture surgery    . Rectocele repair     Medications: Reviewed & Updated - see associated section                       Current outpatient prescriptions:  .  cholecalciferol (VITAMIN D) 1000 UNITS tablet, Take 2,000 Units by mouth daily. , Disp: , Rfl:  .  megestrol (MEGACE) 40 MG tablet, Take 1 tablet (40 mg total) by mouth 3 (three) times daily. Until bleeding controlled then once daily, Disp: 45 tablet, Rfl: 2 .  norethindrone (MICRONOR,CAMILA,ERRIN) 0.35 MG tablet, Take 1 tablet (0.35 mg total) by mouth daily. (Patient not taking: Reported on 04/01/2015), Disp: 1 Package, Rfl: 11 .  Vitamin D, Ergocalciferol, (DRISDOL) 50000 units CAPS capsule, Take 50,000 Units by mouth every 30 (thirty) days., Disp: , Rfl:    Social History: Reviewed -  reports that she has quit smoking. She has never used smokeless tobacco.  Objective Findings:  Vitals: Last menstrual period  03/26/2015. Physical Examination: General appearance - alert, well appearing, and in no distress Mental status - alert, oriented to person, place, and time Eyes - pupils equal and reactive, extraocular eye movements intact Heart - normal rate and regular rhythm Abdomen - soft, nontender, nondistended, no masses or organomegaly Pelvic - VULVA: normal appearing vulva with no masses, tenderness or lesions, VAGINA: normal appearing vagina with normal color and discharge, no lesions, CERVIX: normal appearing cervix without discharge or lesions, UTERUS: uterus is normal size, shape, consistency and nontender    Endometrial Biopsy: Patient given informed consent, signed copy in the chart, time out was performed. Time out taken. The patient was placed in the lithotomy position and the cervix brought into view with sterile speculum.  Portio of cervix cleansed x 2 with betadine swabs.  A tenaculum was placed in the anterior lip of the cervix. (Pt finds tenaculum rather uncomfortable.) The uterus was sounded for depth of 8 cm,. Milex uterine Explora 3 mm was introduced to into the uterus, suction created,  and an endometrial sample was obtained. All equipment was removed and accounted for.   The patient tolerated the procedure well.    Patient given post procedure instructions.    Assessment & Plan:   A: Symmetrically thickened  endometrium 1. Menorrhagia s/p tubal reversal (no longer desires pregnancy) 2. Sonohysterogram and endometrial biopsy done.  P:  1. Return 1 wk for tissue results. 2. Consider endometrial ablation versus vag hyst, vs IUD.  By signing my name below, I, Ronney LionSuzanne Le, attest that this documentation has been prepared under the direction and in the presence of Tilda BurrowJohn Amberlynn Tempesta V, MD. Electronically Signed: Ronney LionSuzanne Le, ED Scribe. 04/15/2015. 11:37 AM.  I personally performed the services described in this documentation, which was SCRIBED in my presence. The recorded information has  been reviewed and considered accurate. It has been edited as necessary during review. Tilda BurrowFERGUSON,Caliah Kopke V, MD

## 2015-04-15 NOTE — Progress Notes (Signed)
US sonohysterogram: normal homogenous anteverted uterus,Dr Emelda FearFerguson preformed the sonohysterogram,the saline outlined the smooth symmetrical walls of the endometrium,the endometrium is thickened 6020mm,but no solitary mass was noted w/in the endometrial cavity,normal ov's bilat

## 2015-04-17 ENCOUNTER — Telehealth: Payer: Self-pay | Admitting: Obstetrics and Gynecology

## 2015-04-17 DIAGNOSIS — N920 Excessive and frequent menstruation with regular cycle: Secondary | ICD-10-CM

## 2015-04-17 MED ORDER — MEDROXYPROGESTERONE ACETATE 10 MG PO TABS: 10.0000 mg | ORAL_TABLET | Freq: Every day | ORAL | Status: DC

## 2015-04-17 NOTE — Telephone Encounter (Signed)
Pt with pelvic ache and ache in to inner thighs.  Pt on Doxy prophylaxis p procedure. Pt Pathology; Secretory endometrium. Pt is not on megace or micronor.  IMP; benign secretory endometiurm         No sign of infection at present Plan: pt to begin Provera x 2 wk 10 mg/d         Pt should let us know if she is not better by next monday

## 2015-04-19 ENCOUNTER — Telehealth: Payer: Self-pay | Admitting: Cardiology

## 2015-04-19 NOTE — Telephone Encounter (Signed)
Pt called in Rx for Tramadol and toradol, and was asked to make appt to discuss surgery options,

## 2015-04-24 ENCOUNTER — Encounter: Payer: Self-pay | Admitting: Obstetrics and Gynecology

## 2015-04-24 ENCOUNTER — Telehealth: Payer: Self-pay | Admitting: Obstetrics and Gynecology

## 2015-04-24 ENCOUNTER — Ambulatory Visit (INDEPENDENT_AMBULATORY_CARE_PROVIDER_SITE_OTHER): Payer: Managed Care, Other (non HMO) | Admitting: Obstetrics and Gynecology

## 2015-04-24 VITALS — BP 108/60 | Ht 62.5 in | Wt 171.0 lb

## 2015-04-24 DIAGNOSIS — N920 Excessive and frequent menstruation with regular cycle: Secondary | ICD-10-CM

## 2015-04-24 DIAGNOSIS — N949 Unspecified condition associated with female genital organs and menstrual cycle: Secondary | ICD-10-CM

## 2015-04-24 DIAGNOSIS — R102 Pelvic and perineal pain: Secondary | ICD-10-CM

## 2015-04-24 DIAGNOSIS — G8929 Other chronic pain: Secondary | ICD-10-CM

## 2015-04-24 NOTE — Progress Notes (Addendum)
Patient ID: Shanae Luo, female   DOB: 01/08/1971, 45 y.o.   MRN: [id number]   [facility] Visit  Patient name: Mary Gilmore MRN [id number]  Date of birth: 12-19-1970  CC & HPI:  Mary Gilmore is a 45 y.o. female, with a Hx of tubal reversal completed three years ago, presenting today for f/u on an endometrial biopsy completed approximately one week ago. The endometrial biopsy was completed due to pt's complaints of severe, aching pelvic pain, irregular thickened 18 mm endometrial stripe visualized on an u/s on 04/01/15, and chronic menometrorrhagia onset nearly nine months ago. Pt notes that her pelvic pain generally occurs prior to getting her menstrual cycle; after the start of her menstrual cycle the pain subsides; however, the pain starts again once the menstrual bleeding subsides. Associated Sx include dysphoric mood after progesterone use, lower back pain, intermittent nausea, breast tenderness, intermittent headache, lightheadedness, skin irritation involving generalized redness and itching. Today, pt reports that the bleeding has stopped and her menstrual cycle is scheduled to start soon. Pt reports she does not tolerate hormonal medications and reports intolerance to progesterone.   ROS:  10 Systems reviewed and all are negative for acute change except as noted in the HPI.  Pertinent History Reviewed:   Reviewed: Significant for menorrhagia, tubal ligation, tubal reversal, rectocele repair  Medical         Past Medical History  Diagnosis Date  . Unspecified symptom associated with female genital organs 07/01/2012  . Hypotension   . Menorrhagia 07/03/2013  . Patient desires pregnancy 07/03/2013                              Surgical Hx:    Past Surgical History  Procedure Laterality Date  . Tubal ligation    . Tubal reversal  06/06/2012  . Mandible fracture surgery    . Rectocele repair     Medications: Reviewed & Updated - see associated section                        Current outpatient prescriptions:  .  cholecalciferol (VITAMIN D) 1000 UNITS tablet, Take 2,000 Units by mouth daily. , Disp: , Rfl:  .  Vitamin D, Ergocalciferol, (DRISDOL) 50000 units CAPS capsule, Take 50,000 Units by mouth every 30 (thirty) days., Disp: , Rfl:  .  medroxyPROGESTERone (PROVERA) 10 MG tablet, Take 1 tablet (10 mg total) by mouth daily. One tablet daily x 2 weeks. Repeat as directed (Patient not taking: Reported on 04/24/2015), Disp: 14 tablet, Rfl: 3   Social History: Reviewed -  reports that she has quit smoking. She has never used smokeless tobacco.  Objective Findings:  Vitals: Blood pressure 108/60, height 5' 2.5" (1.588 m), weight 171 lb (77.565 kg), last menstrual period 03/26/2015.  Physical Examination: General appearance - alert, well appearing, and in no distress and oriented to person, place, and time Pelvic - normal external genitalia, vulva, vagina, cervix, uterus and adnexa,  VULVA: normal appearing vulva with no masses, tenderness or lesions,  VAGINA: normal appearing vagina with normal color and discharge, no lesions,  CERVIX: normal appearing cervix without discharge or lesions,  UTERUS: uterus is normal size, shape, consistency, well supported, moderately sensitive to touch.  ADNEXA: normal adnexa in size, nontender and no masses  Discussed with pt risks and benefits of supracervical hysterectomy. At end of discussion, pt had opportunity to ask questions and has no  further questions at this time.  Greater than 50% was spent in counseling and coordination of care with the patient. Total time greater than: 10 minutes   Assessment & Plan:   A: CHronic pelvic pain. 1. IUD ruled out due to adverse side effects of progesterone use.  2.   P:  1. supracervical hysterectomy , to be finalized at next appt.     By signing my name below, I, Marica Otterusrat Rahman, attest that this documentation has been prepared under the direction and in the presence of Christin BachJohn  Birdia Jaycox, MD. Electronically Signed: Marica OtterNusrat Rahman, ED Scribe. 04/24/2015. 11:25 AM.   I personally performed the services described in this documentation, which was SCRIBED in my presence. The recorded information has been reviewed and considered accurate. It has been edited as necessary during review. Tilda BurrowFERGUSON,Telsa Dillavou V, MD

## 2015-04-24 NOTE — Progress Notes (Signed)
Patient ID: Ilda MoriSherry Gilmore, female   DOB: [birth year], 45 y.o.   MRN: [id number] Pt here today for follow up visit. Pt had HSG and endometrial biopsy at her last visit. Pt states that she has stopped bleeding but will soon start her period.

## 2015-04-30 DIAGNOSIS — R102 Pelvic and perineal pain: Secondary | ICD-10-CM

## 2015-04-30 DIAGNOSIS — G8929 Other chronic pain: Secondary | ICD-10-CM | POA: Insufficient documentation

## 2015-05-01 NOTE — Telephone Encounter (Signed)
Note routed to Dr. Emelda FearFerguson

## 2015-05-03 ENCOUNTER — Other Ambulatory Visit (HOSPITAL_COMMUNITY): Payer: Managed Care, Other (non HMO)

## 2015-05-06 ENCOUNTER — Encounter: Payer: Self-pay | Admitting: Obstetrics and Gynecology

## 2015-05-06 ENCOUNTER — Encounter: Payer: Self-pay | Admitting: *Deleted

## 2015-05-06 ENCOUNTER — Ambulatory Visit (INDEPENDENT_AMBULATORY_CARE_PROVIDER_SITE_OTHER): Payer: Managed Care, Other (non HMO) | Admitting: Obstetrics and Gynecology

## 2015-05-06 VITALS — BP 112/70 | Ht 62.5 in | Wt 171.0 lb

## 2015-05-06 DIAGNOSIS — N949 Unspecified condition associated with female genital organs and menstrual cycle: Secondary | ICD-10-CM

## 2015-05-06 DIAGNOSIS — R102 Pelvic and perineal pain: Secondary | ICD-10-CM

## 2015-05-06 DIAGNOSIS — N920 Excessive and frequent menstruation with regular cycle: Secondary | ICD-10-CM | POA: Diagnosis not present

## 2015-05-06 DIAGNOSIS — G8929 Other chronic pain: Secondary | ICD-10-CM

## 2015-05-06 NOTE — Progress Notes (Signed)
Patient ID: Mary MoriSherry Fuertes, female   DOB: 1971/01/16, 45 y.o.   MRN: [id number]   [facility]  Patient name: Mary Gilmore MRN [id number]  Date of birth: 1971/01/16  CC & HPI:  Mary MoriSherry Kloepfer is a 45 y.o. female presenting today for discussion regarding supracervical hysterectomy for frequent menstrual bleeding with associated pain. Pt reports she had menstrual period from 04/15/15-04/19/15; then she had another menstrual period that began on 04/25/15; and pt again began to have a menstrual period yesterday. Pt reports associated pelvic pain with each menstrual period. Pt states that she can tolerate the pain for now as long as the bleeding is controlled. Pt reports using [name] intermittently with no relief. ROS:  Review of Systems  Genitourinary:       Heavy and frequent periods, lasting 7 days with dysmenorrhea  Musculoskeletal:          All other systems reviewed and are negative.  Pertinent History Reviewed:   Reviewed: Significant for menorrhagia, tubal ligation, tubal reversal, rectocele repair.  Medical         Past Medical History  Diagnosis Date  . Unspecified symptom associated with female genital organs 07/01/2012  . Hypotension   . Menorrhagia 07/03/2013  . Patient desires pregnancy 07/03/2013                              Surgical Hx:    Past Surgical History  Procedure Laterality Date  . Tubal ligation    . Tubal reversal  06/06/2012  . Mandible fracture surgery    . Rectocele repair     Medications: Reviewed & Updated - see associated section                       Current outpatient prescriptions:  .  cholecalciferol (VITAMIN D) 1000 UNITS tablet, Take 2,000 Units by mouth daily. , Disp: , Rfl:  .  medroxyPROGESTERone (PROVERA) 10 MG tablet, Take 1 tablet (10 mg total) by mouth daily. One tablet daily x 2 weeks. Repeat as directed (Patient not taking: Reported on 04/24/2015), Disp: 14 tablet, Rfl: 3 .  Vitamin D, Ergocalciferol, (DRISDOL) 50000 units  CAPS capsule, Take 50,000 Units by mouth every 30 (thirty) days., Disp: , Rfl:    Social History: Reviewed -  reports that she has quit smoking. She has never used smokeless tobacco.  Objective Findings:  Vitals: Blood pressure 112/70, height 5' 2.5" (1.588 m), weight 171 lb (77.565 kg), last menstrual period 03/26/2015.  Physical Examination: Presents for discussion only.  Discussed with pt risks and benefits of endometrial ablation v. supracervical hysterectomy. At end of discussion, pt had opportunity to ask questions and has no further questions at this time.  Greater than 50% was spent in counseling and coordination of care with the patient. Total time greater than: 15 minutes    Assessment & Plan:   A:  1. Heavy and frequent periods. 2. Dysmenorrhea  P:  1. Pt wishes to pursue Endometrial Ablation instead of hysterectomy.  2. Referred to Amy Barns for surgery change.  2. Daily [name] until ablation.     By signing my name below, I, Marica Otterusrat Rahman, attest that this documentation has been prepared under the direction and in the presence of Christin BachJohn Merit Gadsby, MD. Electronically Signed: Marica OtterNusrat Rahman, ED Scribe. 05/06/2015. 12:32 PM.   I personally performed the services described in this documentation, which was SCRIBED in  my presence. The recorded information has been reviewed and considered accurate. It has been edited as necessary during review. Jonnie Kind, MD

## 2015-05-14 ENCOUNTER — Encounter: Payer: Managed Care, Other (non HMO) | Admitting: Obstetrics and Gynecology

## 2015-05-15 ENCOUNTER — Other Ambulatory Visit (HOSPITAL_COMMUNITY): Payer: Managed Care, Other (non HMO)

## 2015-05-16 ENCOUNTER — Other Ambulatory Visit (HOSPITAL_COMMUNITY): Payer: Managed Care, Other (non HMO)

## 2015-05-16 NOTE — Patient Instructions (Signed)
Laurence AlySherry E Rohm  05/16/2015     @PREFPERIOPPHARMACY @   Your procedure is scheduled on 05/21/15.  [facility]  Call this number if you have problems the morning of surgery:  [phone]   Remember:  Do not eat food or drink liquids after midnight.  Take these medicines the morning of surgery with A SIP OF WATER None   Do not wear jewelry, make-up or nail polish.  Do not wear lotions, powders, or perfumes.  You may wear deodorant.  Do not shave 48 hours prior to surgery.  Men may shave face and neck.  Do not bring valuables to the hospital.  [facility] is not responsible for any belongings or valuables.  Contacts, dentures or bridgework may not be worn into surgery.  Leave your suitcase in the car.  After surgery it may be brought to your room.  For patients admitted to the hospital, discharge time will be determined by your treatment team.  Patients discharged the day of surgery will not be allowed to drive home.    Please read over the following fact sheets that you were given. Surgical Site Infection Prevention and Anesthesia Post-op Instructions     PATIENT INSTRUCTIONS POST-ANESTHESIA  IMMEDIATELY FOLLOWING SURGERY:  Do not drive or operate machinery for the first twenty four hours after surgery.  Do not make any important decisions for twenty four hours after surgery or while taking narcotic pain medications or sedatives.  If you develop intractable nausea and vomiting or a severe headache please notify your doctor immediately.  FOLLOW-UP:  Please make an appointment with your surgeon as instructed. You do not need to follow up with anesthesia unless specifically instructed to do so.  WOUND CARE INSTRUCTIONS (if applicable):  Keep a dry clean dressing on the anesthesia/puncture wound site if there is drainage.  Once the wound has quit draining you may leave it open to air.  Generally you should leave the bandage intact for twenty four hours unless  there is drainage.  If the epidural site drains for more than 36-48 hours please call the anesthesia department.  QUESTIONS?:  Please feel free to call your physician or the hospital operator if you have any questions, and they will be happy to assist you.      Dilation and Curettage or Vacuum Curettage Dilation and curettage (D&C) and vacuum curettage are minor procedures. A D&C involves stretching (dilation) the cervix and scraping (curettage) the inside lining of the womb (uterus). During a D&C, tissue is gently scraped from the inside lining of the uterus. During a vacuum curettage, the lining and tissue in the uterus are removed with the use of gentle suction.  Curettage may be performed to either diagnose or treat a problem. As a diagnostic procedure, curettage is performed to examine tissues from the uterus. A diagnostic curettage may be performed for the following symptoms:   Irregular bleeding in the uterus.   Bleeding with the development of clots.   Spotting between menstrual periods.   Prolonged menstrual periods.   Bleeding after menopause.   No menstrual period (amenorrhea).   A change in size and shape of the uterus.  As a treatment procedure, curettage may be performed for the following reasons:   Removal of an IUD (intrauterine device).   Removal of retained placenta after giving birth. Retained placenta can cause an infection or bleeding severe enough to require transfusions.   Abortion.   Miscarriage.   Removal of  polyps inside the uterus.   Removal of uncommon types of noncancerous lumps (fibroids).  LET [facility] KNOW ABOUT:   Any allergies you have.   All medicines you are taking, including vitamins, herbs, eye drops, creams, and over-the-counter medicines.   Previous problems you or members of your family have had with the use of anesthetics.   Any blood disorders you have.   Previous surgeries you have had.    Medical conditions you have. RISKS AND COMPLICATIONS  Generally, this is a safe procedure. However, as with any procedure, complications can occur. Possible complications include:  Excessive bleeding.   Infection of the uterus.   Damage to the cervix.   Development of scar tissue (adhesions) inside the uterus, later causing abnormal amounts of menstrual bleeding.   Complications from the general anesthetic, if a general anesthetic is used.   Putting a hole (perforation) in the uterus. This is rare.  BEFORE THE PROCEDURE   Eat and drink before the procedure only as directed by your health care provider.   Arrange for someone to take you home.  PROCEDURE  This procedure usually takes about 15-30 minutes.  You will be given one of the following:  A medicine that numbs the area in and around the cervix (local anesthetic).   A medicine to make you sleep through the procedure (general anesthetic).  You will lie on your back with your legs in stirrups.   A warm metal or plastic instrument (speculum) will be placed in your vagina to keep it open and to allow the health care provider to see the cervix.  There are two ways in which your cervix can be softened and dilated. These include:   Taking a medicine.   Having thin rods (laminaria) inserted into your cervix.   A curved tool (curette) will be used to scrape cells from the inside lining of the uterus. In some cases, gentle suction is applied with the curette. The curette will then be removed.  AFTER THE PROCEDURE   You will rest in the recovery area until you are stable and are ready to go home.   You may feel sick to your stomach (nauseous) or throw up (vomit) if you were given a general anesthetic.   You may have a sore throat if a tube was placed in your throat during general anesthesia.   You may have light cramping and bleeding. This may last for 2 days to 2 weeks after the procedure.   Your  uterus needs to make a new lining after the procedure. This may make your next period late.   This information is not intended to replace advice given to you by your health care provider. Make sure you discuss any questions you have with your health care provider.   Document Released: 01/19/2005 Document Revised: 09/21/2012 Document Reviewed: 08/18/2012 Elsevier Interactive Patient Education [employer]2016 Elsevier Inc.

## 2015-05-17 ENCOUNTER — Other Ambulatory Visit (HOSPITAL_COMMUNITY): Payer: Managed Care, Other (non HMO)

## 2015-05-20 ENCOUNTER — Encounter (HOSPITAL_COMMUNITY)
Admission: RE | Admit: 2015-05-20 | Discharge: 2015-05-20 | Disposition: A | Discharge status: Home / Self Care | Payer: Managed Care, Other (non HMO) | Source: Ambulatory Visit | Attending: Cardiology | Admitting: Cardiology

## 2015-05-20 ENCOUNTER — Encounter (HOSPITAL_COMMUNITY): Payer: Self-pay

## 2015-05-20 ENCOUNTER — Other Ambulatory Visit: Payer: Self-pay | Admitting: Obstetrics and Gynecology

## 2015-05-20 DIAGNOSIS — Z01812 Encounter for preprocedural laboratory examination: Secondary | ICD-10-CM | POA: Diagnosis not present

## 2015-05-20 DIAGNOSIS — N920 Excessive and frequent menstruation with regular cycle: Secondary | ICD-10-CM | POA: Diagnosis present

## 2015-05-20 DIAGNOSIS — Z87891 Personal history of nicotine dependence: Secondary | ICD-10-CM | POA: Diagnosis not present

## 2015-05-20 DIAGNOSIS — N946 Dysmenorrhea, unspecified: Secondary | ICD-10-CM | POA: Diagnosis not present

## 2015-05-20 HISTORY — DX: Headache, unspecified: R51.9

## 2015-05-20 HISTORY — DX: Other complications of anesthesia, initial encounter: T88.59XA

## 2015-05-20 HISTORY — DX: Adverse effect of unspecified anesthetic, initial encounter: T41.45XA

## 2015-05-20 HISTORY — DX: Headache: R51

## 2015-05-20 HISTORY — DX: Other specified postprocedural states: R11.2

## 2015-05-20 HISTORY — DX: Other specified postprocedural states: Z98.890

## 2015-05-20 LAB — BASIC METABOLIC PANEL
Anion gap: 6 (ref 5–15)
BUN: 13 mg/dL (ref 6–20)
CO2: 25 mmol/L (ref 22–32)
Calcium: 9 mg/dL (ref 8.9–10.3)
Chloride: 107 mmol/L (ref 101–111)
Creatinine, Ser: 0.9 mg/dL (ref 0.44–1.00)
GFR calc Af Amer: >60 mL/min (ref >60)
GFR calc non Af Amer: >60 mL/min (ref >60)
Glucose, Bld: 98 mg/dL (ref 65–99)
Potassium: 4.2 mmol/L (ref 3.5–5.1)
Sodium: 138 mmol/L (ref 135–145)

## 2015-05-20 LAB — CBC
HCT: 36.5 % (ref 36.0–46.0)
Hemoglobin: 12.4 g/dL (ref 12.0–15.0)
MCH: 32.5 pg (ref 26.0–34.0)
MCHC: 34 g/dL (ref 30.0–36.0)
MCV: 95.8 fL (ref 78.0–100.0)
Platelets: 218 10*3/uL (ref 150–400)
RBC: 3.81 MIL/uL — ABNORMAL LOW (ref 3.87–5.11)
RDW: 14 % (ref 11.5–15.5)
WBC: 8.9 10*3/uL (ref 4.0–10.5)

## 2015-05-20 LAB — HCG, SERUM, QUALITATIVE: Preg, Serum: NEGATIVE

## 2015-05-20 NOTE — H&P (Signed)
   Progress Notes      Mary BurrowJohn Tiawanna Luchsinger V, MD at 05/06/2015 11:54 AM     Status: Signed       Expand All Collapse All   Patient ID: Mary MoriSherry Gilmore, female DOB: Aug 03, 1970, 45 y.o. MRN: [id number]  [facility]  Patient name: Kearney HardSherry ThomasMRN [id number] Date of birth: Aug 03, 1970  CC & HPI:  Mary Gilmore is a 45 y.o. female presenting today for discussion regarding surgical treatment of  frequent menstrual bleeding with associated pain. Pt reports she had menstrual period from 04/15/15-04/19/15; then she had another menstrual period that began on 04/25/15; and pt again began to have a menstrual period yesterday. Pt reports associated pelvic pain with each menstrual period. Pt states that she can tolerate the pain for now as long as the bleeding is controlled. Pt reports using [name] intermittently with no relief. ROS:  Review of Systems  Genitourinary:   Heavy and frequent periods, lasting 7 days with dysmenorrhea  Musculoskeletal:     All other systems reviewed and are negative.  Pertinent History Reviewed:  Reviewed: Significant for menorrhagia, tubal ligation, tubal reversal, rectocele repair.  Medical  Past Medical History  Diagnosis Date  . Unspecified symptom associated with female genital organs 07/01/2012  . Hypotension   . Menorrhagia 07/03/2013  . Patient desires pregnancy 07/03/2013    Surgical Hx:  Past Surgical History  Procedure Laterality Date  . Tubal ligation    . Tubal reversal  06/06/2012  . Mandible fracture surgery    . Rectocele repair     Medications: Reviewed & Updated - see associated section   Current outpatient prescriptions:  . cholecalciferol (VITAMIN D) 1000 UNITS tablet, Take 2,000 Units by mouth daily. , Disp: , Rfl:  . medroxyPROGESTERone (PROVERA) 10 MG tablet, Take 1 tablet (10 mg total) by mouth  daily. One tablet daily x 2 weeks. Repeat as directed (Patient not taking: Reported on 04/24/2015), Disp: 14 tablet, Rfl: 3 . Vitamin D, Ergocalciferol, (DRISDOL) 50000 units CAPS capsule, Take 50,000 Units by mouth every 30 (thirty) days., Disp: , Rfl:    Social History: Reviewed -  reports that she has quit smoking. She has never used smokeless tobacco.  Objective Findings:  Vitals: Blood pressure 112/70, height 5' 2.5" (1.588 m), weight 171 lb (77.565 kg), last menstrual period 03/26/2015.  Physical Examination: Presents for discussion only.  Discussed with pt risks and benefits of endometrial ablation Gilmore. supracervical hysterectomy. At end of discussion, pt had opportunity to ask questions and has no further questions at this time.  Greater than 50% was spent in counseling and coordination of care with the patient. Total time greater than: 15 minutes    Assessment & Plan:   A:  1. Heavy and frequent periods. 2. Dysmenorrhea  P:  1. Pt wishes to pursue Endometrial Ablation instead of hysterectomy.  2. Referred to Amy Barns for surgery change.  2. Daily [name] until ablation.

## 2015-05-21 ENCOUNTER — Ambulatory Visit (HOSPITAL_COMMUNITY)
Admission: RE | Admit: 2015-05-21 | Discharge: 2015-05-21 | Disposition: A | Discharge status: Home / Self Care | Payer: Managed Care, Other (non HMO) | Source: Ambulatory Visit | Attending: Obstetrics and Gynecology | Admitting: Obstetrics and Gynecology

## 2015-05-21 ENCOUNTER — Encounter (HOSPITAL_COMMUNITY)
Admission: RE | Disposition: A | Discharge status: Home / Self Care | Payer: Self-pay | Source: Ambulatory Visit | Attending: Obstetrics and Gynecology

## 2015-05-21 ENCOUNTER — Ambulatory Visit (HOSPITAL_COMMUNITY): Payer: Managed Care, Other (non HMO) | Admitting: Anesthesiology

## 2015-05-21 ENCOUNTER — Encounter (HOSPITAL_COMMUNITY): Payer: Self-pay | Admitting: *Deleted

## 2015-05-21 DIAGNOSIS — N946 Dysmenorrhea, unspecified: Secondary | ICD-10-CM | POA: Insufficient documentation

## 2015-05-21 DIAGNOSIS — Z01812 Encounter for preprocedural laboratory examination: Secondary | ICD-10-CM | POA: Insufficient documentation

## 2015-05-21 DIAGNOSIS — N920 Excessive and frequent menstruation with regular cycle: Secondary | ICD-10-CM | POA: Diagnosis not present

## 2015-05-21 DIAGNOSIS — Z87891 Personal history of nicotine dependence: Secondary | ICD-10-CM | POA: Insufficient documentation

## 2015-05-21 HISTORY — PX: DILITATION & CURRETTAGE/HYSTROSCOPY WITH NOVASURE ABLATION: SHX5568

## 2015-05-21 LAB — URINALYSIS, ROUTINE W REFLEX MICROSCOPIC
Bilirubin Urine: NEGATIVE
Glucose, UA: NEGATIVE mg/dL
Ketones, ur: NEGATIVE mg/dL
Leukocytes, UA: NEGATIVE
Nitrite: NEGATIVE
Protein, ur: NEGATIVE mg/dL
Specific Gravity, Urine: 1.025 (ref 1.005–1.030)
pH: 5.5 (ref 5.0–8.0)

## 2015-05-21 LAB — URINE MICROSCOPIC-ADD ON
Bacteria, UA: NONE SEEN
WBC, UA: NONE SEEN WBC/hpf (ref 0–5)

## 2015-05-21 SURGERY — DILATATION & CURETTAGE/HYSTEROSCOPY WITH NOVASURE ABLATION
Anesthesia: General

## 2015-05-21 MED ORDER — OXYCODONE-ACETAMINOPHEN 5-325 MG PO TABS
1.0000 | ORAL_TABLET | ORAL | Status: DC | PRN
Start: 2015-05-21 — End: 2015-06-05

## 2015-05-21 MED ORDER — FENTANYL CITRATE (PF) 100 MCG/2ML IJ SOLN
INTRAMUSCULAR | Status: AC
  Filled 2015-05-21: qty 2

## 2015-05-21 MED ORDER — BUPIVACAINE HCL (PF) 0.5 % IJ SOLN
INTRAMUSCULAR | Status: AC
  Filled 2015-05-21: qty 30

## 2015-05-21 MED ORDER — SCOPOLAMINE 1 MG/3DAYS TD PT72
1.0000 | MEDICATED_PATCH | Freq: Once | TRANSDERMAL | Status: DC
  Administered 2015-05-21: 1.5 mg via TRANSDERMAL

## 2015-05-21 MED ORDER — PROPOFOL 10 MG/ML IV BOLUS
INTRAVENOUS | Status: DC | PRN
  Administered 2015-05-21: 140 mg via INTRAVENOUS

## 2015-05-21 MED ORDER — HYDROCODONE-ACETAMINOPHEN 5-325 MG PO TABS
ORAL_TABLET | ORAL | Status: AC
  Filled 2015-05-21: qty 2

## 2015-05-21 MED ORDER — MIDAZOLAM HCL 2 MG/2ML IJ SOLN
INTRAMUSCULAR | Status: AC
  Filled 2015-05-21: qty 2

## 2015-05-21 MED ORDER — FENTANYL CITRATE (PF) 100 MCG/2ML IJ SOLN
INTRAMUSCULAR | Status: DC | PRN
  Administered 2015-05-21: 25 ug via INTRAVENOUS
  Administered 2015-05-21: 50 ug via INTRAVENOUS
  Administered 2015-05-21: 25 ug via INTRAVENOUS

## 2015-05-21 MED ORDER — FENTANYL CITRATE (PF) 100 MCG/2ML IJ SOLN
25.0000 ug | INTRAMUSCULAR | Status: DC | PRN
  Administered 2015-05-21 (×4): 25 ug via INTRAVENOUS
  Filled 2015-05-21: qty 2

## 2015-05-21 MED ORDER — KETOROLAC TROMETHAMINE 10 MG PO TABS: 10.0000 mg | ORAL_TABLET | Freq: Four times a day (QID) | ORAL | Status: DC | PRN

## 2015-05-21 MED ORDER — LACTATED RINGERS IV SOLN
INTRAVENOUS | Status: DC
  Administered 2015-05-21 (×2): via INTRAVENOUS

## 2015-05-21 MED ORDER — ONDANSETRON HCL 4 MG/2ML IJ SOLN
4.0000 mg | Freq: Once | INTRAMUSCULAR | Status: AC | PRN
  Administered 2015-05-21: 4 mg via INTRAVENOUS

## 2015-05-21 MED ORDER — HYDROCODONE-ACETAMINOPHEN 5-325 MG PO TABS
1.0000 | ORAL_TABLET | ORAL | Status: DC | PRN
  Administered 2015-05-21: 2 via ORAL

## 2015-05-21 MED ORDER — KETOROLAC TROMETHAMINE 30 MG/ML IJ SOLN
30.0000 mg | Freq: Once | INTRAMUSCULAR | Status: AC
  Administered 2015-05-21: 30 mg via INTRAVENOUS
  Filled 2015-05-21: qty 1

## 2015-05-21 MED ORDER — BUPIVACAINE HCL 0.5 % IJ SOLN
INTRAMUSCULAR | Status: DC | PRN
  Administered 2015-05-21: 20 mL

## 2015-05-21 MED ORDER — MIDAZOLAM HCL 5 MG/5ML IJ SOLN
INTRAMUSCULAR | Status: DC | PRN
  Administered 2015-05-21: 2 mg via INTRAVENOUS

## 2015-05-21 MED ORDER — LIDOCAINE HCL 1 % IJ SOLN
INTRAMUSCULAR | Status: DC | PRN
  Administered 2015-05-21: 25 mg via INTRADERMAL

## 2015-05-21 MED ORDER — MIDAZOLAM HCL 2 MG/2ML IJ SOLN
1.0000 mg | INTRAMUSCULAR | Status: DC | PRN
  Administered 2015-05-21: 2 mg via INTRAVENOUS

## 2015-05-21 MED ORDER — 0.9 % SODIUM CHLORIDE (POUR BTL) OPTIME
TOPICAL | Status: DC | PRN
  Administered 2015-05-21: 1000 mL

## 2015-05-21 MED ORDER — SCOPOLAMINE 1 MG/3DAYS TD PT72
MEDICATED_PATCH | TRANSDERMAL | Status: AC
  Filled 2015-05-21: qty 1

## 2015-05-21 MED ORDER — CEFAZOLIN SODIUM-DEXTROSE 2-4 GM/100ML-% IV SOLN
2.0000 g | INTRAVENOUS | Status: AC
  Administered 2015-05-21: 2 g via INTRAVENOUS
  Filled 2015-05-21: qty 100

## 2015-05-21 MED ORDER — SODIUM CHLORIDE 0.9 % IR SOLN
Status: DC | PRN
  Administered 2015-05-21: 3000 mL

## 2015-05-21 MED ORDER — KETOROLAC TROMETHAMINE 30 MG/ML IJ SOLN: 30.0000 mg | Freq: Once | INTRAMUSCULAR | Status: DC

## 2015-05-21 SURGICAL SUPPLY — 28 items
ABLATOR ENDOMETRIAL BIPOLAR (ABLATOR) ×2 IMPLANT
BAG HAMPER (MISCELLANEOUS) ×2 IMPLANT
CATH ROBINSON RED A/P 16FR (CATHETERS) ×2 IMPLANT
CLOTH BEACON ORANGE TIMEOUT ST (SAFETY) ×2 IMPLANT
COVER LIGHT HANDLE STERIS (MISCELLANEOUS) ×4 IMPLANT
DECANTER SPIKE VIAL GLASS SM (MISCELLANEOUS) ×2 IMPLANT
FORMALIN 10 PREFIL 120ML (MISCELLANEOUS) ×2 IMPLANT
GLOVE BIOGEL PI IND STRL 7.0 (GLOVE) ×2 IMPLANT
GLOVE BIOGEL PI IND STRL 9 (GLOVE) ×1 IMPLANT
GLOVE BIOGEL PI INDICATOR 7.0 (GLOVE) ×2
GLOVE BIOGEL PI INDICATOR 9 (GLOVE) ×1
GLOVE ECLIPSE 6.5 STRL STRAW (GLOVE) ×4 IMPLANT
GLOVE ECLIPSE 9.0 STRL (GLOVE) ×2 IMPLANT
GLOVE EXAM NITRILE MD LF STRL (GLOVE) ×2 IMPLANT
GOWN SPEC L3 XXLG W/TWL (GOWN DISPOSABLE) ×2 IMPLANT
GOWN STRL REUS W/TWL LRG LVL3 (GOWN DISPOSABLE) ×2 IMPLANT
INST SET HYSTEROSCOPY (KITS) ×2 IMPLANT
IV NS IRRIG 3000ML ARTHROMATIC (IV SOLUTION) ×2 IMPLANT
KIT ROOM TURNOVER AP CYSTO (KITS) ×2 IMPLANT
KIT ROOM TURNOVER APOR (KITS) ×2 IMPLANT
MANIFOLD NEPTUNE II (INSTRUMENTS) ×2 IMPLANT
NS IRRIG 1000ML POUR BTL (IV SOLUTION) ×2 IMPLANT
PACK PERI GYN (CUSTOM PROCEDURE TRAY) ×2 IMPLANT
PAD ARMBOARD 7.5X6 YLW CONV (MISCELLANEOUS) ×2 IMPLANT
PAD TELFA 3X4 1S STER (GAUZE/BANDAGES/DRESSINGS) ×2 IMPLANT
SET BASIN LINEN APH (SET/KITS/TRAYS/PACK) ×2 IMPLANT
SET IRRIG Y TYPE TUR BLADDER L (SET/KITS/TRAYS/PACK) ×2 IMPLANT
SYR CONTROL 10ML LL (SYRINGE) ×2 IMPLANT

## 2015-05-21 NOTE — Anesthesia Procedure Notes (Signed)
Procedure Name: LMA Insertion Date/Time: 05/21/2015 7:40 AM Performed by: Despina HiddenIDACAVAGE, Sandon Yoho J Pre-anesthesia Checklist: Patient identified, Emergency Drugs available, Suction available and Patient being monitored Patient Re-evaluated:Patient Re-evaluated prior to inductionOxygen Delivery Method: Circle system utilized Preoxygenation: Pre-oxygenation with 100% oxygen Intubation Type: IV induction Ventilation: Mask ventilation without difficulty LMA: LMA inserted LMA Size: 4.0 Grade View: Grade III Number of attempts: 1 Placement Confirmation: breath sounds checked- equal and bilateral and positive ETCO2 Tube secured with: Tape Dental Injury: Teeth and Oropharynx as per pre-operative assessment

## 2015-05-21 NOTE — Anesthesia Preprocedure Evaluation (Signed)
Anesthesia Evaluation  Patient identified by MRN, date of birth, ID band Patient awake    Reviewed: Allergy & Precautions, NPO status , Patient's Chart, lab work & pertinent test results  History of Anesthesia Complications (+) PONV  Airway Mallampati: III  TM Distance: >3 FB Neck ROM: Full    Dental  (+) Teeth Intact, Caps, Dental Advisory Given   Pulmonary former smoker,    Pulmonary exam normal        Cardiovascular Normal cardiovascular exam Rhythm:Regular Rate:Normal     Neuro/Psych  Headaches,    GI/Hepatic   Endo/Other    Renal/GU      Musculoskeletal   Abdominal Normal abdominal exam  (+)   Peds  Hematology   Anesthesia Other Findings   Reproductive/Obstetrics                             Anesthesia Physical Anesthesia Plan  ASA: II  Anesthesia Plan: General   Post-op Pain Management:    Induction: Intravenous  Airway Management Planned: LMA  Additional Equipment:   Intra-op Plan:   Post-operative Plan: Extubation in OR  Informed Consent: I have reviewed the patients History and Physical, chart, labs and discussed the procedure including the risks, benefits and alternatives for the proposed anesthesia with the patient or authorized representative who has indicated his/her understanding and acceptance.   Dental advisory given  Plan Discussed with: CRNA  Anesthesia Plan Comments:         Anesthesia Quick Evaluation

## 2015-05-21 NOTE — Interval H&P Note (Signed)
History and Physical Interval Note:  05/21/2015 7:08 AM  Mary Gilmore  has presented today for surgery, with the diagnosis of MENORRHAGIA  The various methods of treatment have been discussed with the patient and family. After consideration of risks, benefits and other options for treatment, the patient has consented to  Procedure(s): DILATATION & CURETTAGE/HYSTEROSCOPY WITH NOVASURE ABLATION (N/A) as a surgical intervention .  The patient's history has been reviewed, patient examined, no change in status, stable for surgery.  I have reviewed the patient's chart and labs.  Questions were answered to the patient's satisfaction.     Tilda BurrowFERGUSON,Kambree Krauss V

## 2015-05-21 NOTE — Brief Op Note (Signed)
05/21/2015  8:36 AM  PATIENT:  Mary Gilmore  45 y.o. female  PRE-OPERATIVE DIAGNOSIS:  MENORRHAGIA  POST-OPERATIVE DIAGNOSIS:  MENORRHAGIA  PROCEDURE:  Procedure(s): DILATATION, HYSTEROSCOPY WITH NOVASURE ENDOMETRIAL ABLATION; Uterine cavity length 5 cm; uterine cavity width = 4.3 cm; power =  118    watts; time = 1 minutes 48 seconds (N/A)  SURGEON:  Surgeon(s) and Role:    * Tilda BurrowJohn Lisett Dirusso V, MD - Primary  PHYSICIAN ASSISTANT:   ASSISTANTS: none   ANESTHESIA:   local and paracervical block  EBL:  Total I/O In: 1300 [I.V.:1300] Out: 805 [Urine:800; Blood:5]  BLOOD ADMINISTERED:none  DRAINS: none   LOCAL MEDICATIONS USED:  MARCAINE    and Amount: 20 ml  SPECIMEN:  No Specimen  DISPOSITION OF SPECIMEN:  N/A  COUNTS:  YES  TOURNIQUET:  * No tourniquets in log *  DICTATION: .Dragon Dictation  PLAN OF CARE: Discharge to home after PACU  PATIENT DISPOSITION:  PACU - hemodynamically stable.   Delay start of Pharmacological VTE agent (>24hrs) due to surgical blood loss or risk of bleeding: not applicable Details of procedure. Patient was taken operating room prepped and draped for vaginal procedure timeout conducted and procedure confirmed by the surgical team. Speculum was inserted med paracervical block applied, the uterus sounded to 9 cm, dilated to 25 [place] allowing introduction of the 23 [place] 30 operative hysteroscope with visualization of a smooth endometrial cavity with both tubal ostia visible and no suspicion of pathology or perforation. The ureter endometrial ablation device was prepared, the endometrial cavity measured at 5 cm, the ablation sequence completed 1 minute 48 seconds at a power 118 W uterine width was 4.3 cm and uterine cavity length 5 cm. Ablation device was removed, and procedure considered satisfactorily completed. Patient was allowed to awaken and go to recovery room in stable condition

## 2015-05-21 NOTE — Transfer of Care (Signed)
Immediate Anesthesia Transfer of Care Note  Patient: Mary Gilmore  Procedure(s) Performed: Procedure(s): DILATATION, HYSTEROSCOPY WITH NOVASURE ENDOMETRIAL ABLATION; Uterine cavity length 5 cm; uterine cavity width = 4.3 cm; power =  118    watts; time = 1 minutes 48 seconds (N/A)  Patient Location: PACU  Anesthesia Type:General  Level of Consciousness: awake, alert , oriented and patient cooperative  Airway & Oxygen Therapy: Patient Spontanous Breathing and Patient connected to face mask oxygen  Post-op Assessment: Report given to RN, Post -op Vital signs reviewed and stable and Patient moving all extremities  Post vital signs: Reviewed and stable  Last Vitals:  Filed Vitals:   05/21/15 0720 05/21/15 0725  BP: 96/47   Temp:    Resp: 29 18    Complications: No apparent anesthesia complications

## 2015-05-21 NOTE — Discharge Instructions (Signed)
Endometrial Ablation °Endometrial ablation removes the lining of the uterus (endometrium). It is usually a same-day, outpatient treatment. Ablation helps avoid major surgery, such as surgery to remove the cervix and uterus (hysterectomy). After endometrial ablation, you will have little or no menstrual bleeding and may not be able to have children. However, if you are premenopausal, you will need to use a reliable method of birth control following the procedure because of the small chance that pregnancy can occur. °There are different reasons to have this procedure. These reasons include: °· Heavy periods. °· Bleeding that is causing anemia. °· Irregular bleeding. °· Bleeding fibroids on the lining inside the uterus if they are smaller than 3 centimeters. °This procedure may not be possible for you if:  °· You want to have children in the future.   °· You have severe cramps with your menstrual period.   °· You have precancerous or cancerous cells in your uterus.   °· You were recently pregnant.   °· You have gone through menopause.   °· You have had major surgery on your uterus, resulting in thinning of the uterine wall. Surgeries may include: °¨ The removal of one or more uterine fibroids (myomectomy). °¨ A cesarean section with a classic (vertical) incision on your uterus. Ask your health care provider what type of cesarean you had. Sometimes the scar on your skin is different than the scar on your uterus. °Even if you have had surgery on your uterus, certain types of ablation may still be safe for you. Talk with your health care provider. °LET YOUR HEALTH CARE PROVIDER KNOW ABOUT: °· Any allergies you have. °· All medicines you are taking, including vitamins, herbs, eye drops, creams, and over-the-counter medicines. °· Previous problems you or members of your family have had with the use of anesthetics. °· Any blood disorders you have. °· Previous surgeries you have had. °· Medical conditions you have. °RISKS AND  COMPLICATIONS  °Generally, this is a safe procedure. However, as with any procedure, complications can occur. Possible complications include: °· Perforation of the uterus. °· Bleeding. °· Infection of the uterus, bladder, or vagina. °· Injury to surrounding organs. °· An air bubble to the lung (air embolus). °· Pregnancy following the procedure. °· Failure of the procedure to help the problem, requiring hysterectomy. °· Decreased ability to diagnose cancer in the lining of the uterus. °BEFORE THE PROCEDURE °· The lining of the uterus must be tested to make sure there is no pre-cancerous or cancer cells present. °· An ultrasound may be performed to look at the size of the uterus and to check for abnormalities. °· Medicines may be given to thin the lining of the uterus. °PROCEDURE  °During the procedure, your health care provider will use a tool called a resectoscope to help see inside your uterus. There are different ways to remove the lining of your uterus.  °· Radiofrequency - This method uses a radiofrequency-alternating electric current to remove the lining of the uterus. °· Cryotherapy - This method uses extreme cold to freeze the lining of the uterus. °· Heated-Free Liquid - This method uses heated salt (saline) solution to remove the lining of the uterus. °· Microwave - This method uses high-energy microwaves to heat up the lining of the uterus to remove it. °· Thermal balloon - This method involves inserting a catheter with a balloon tip into the uterus. The balloon tip is filled with heated fluid to remove the lining of the uterus. °AFTER THE PROCEDURE  °After your procedure, do   not have sexual intercourse or insert anything into your vagina until permitted by your health care provider. After the procedure, you may experience: °· Cramps. °· Vaginal discharge. °· Frequent urination. °  °This information is not intended to replace advice given to you by your health care provider. Make sure you discuss any  questions you have with your health care provider. °  °Document Released: 11/29/2003 Document Revised: 10/10/2014 Document Reviewed: 06/22/2012 °Elsevier Interactive Patient Education ©2016 Elsevier Inc. ° °

## 2015-05-21 NOTE — Interval H&P Note (Signed)
History and Physical Interval Note:  05/21/2015 7:01 AM  Mary Gilmore  has presented today for surgery, with the diagnosis of MENORRHAGIA  The various methods of treatment have been discussed with the patient and family. After consideration of risks, benefits and other options for treatment, the patient has consented to  Procedure(s): DILATATION & CURETTAGE/HYSTEROSCOPY WITH NOVASURE ABLATION (N/A) as a surgical intervention .  The patient's history has been reviewed, patient examined, no change in status, stable for surgery.  I have reviewed the patient's chart and labs.  Questions were answered to the patient's satisfaction.   The patient wishes to avoid General Anesthesia. Anesthesia is considering a carbocaine spinal. Operative time anticipated to be less than 30 mins.  Tilda BurrowFERGUSON,Vere Diantonio V

## 2015-05-21 NOTE — Op Note (Signed)
Please see the brief operative note for surgical details 

## 2015-05-21 NOTE — Anesthesia Postprocedure Evaluation (Signed)
Anesthesia Post Note  Patient: Laurence AlySherry E Landino  Procedure(s) Performed: Procedure(s) (LRB): DILATATION, HYSTEROSCOPY WITH NOVASURE ENDOMETRIAL ABLATION; Uterine cavity length 5 cm; uterine cavity width = 4.3 cm; power =  118    watts; time = 1 minutes 48 seconds (N/A)  Patient location during evaluation: PACU Anesthesia Type: General Level of consciousness: awake, awake and alert and oriented Pain management: pain level controlled Vital Signs Assessment: post-procedure vital signs reviewed and stable Respiratory status: spontaneous breathing, nonlabored ventilation and respiratory function stable Cardiovascular status: blood pressure returned to baseline Postop Assessment: no signs of nausea or vomiting Anesthetic complications: no    Last Vitals:  Filed Vitals:   05/21/15 0928 05/21/15 0950  BP: 113/67 122/59  Pulse: 57 67  Temp: 36.6 C 36.6 C  Resp: 16 16    Last Pain:  Filed Vitals:   05/21/15 0951  PainSc: 7                  Jahaziel Francois J

## 2015-05-22 ENCOUNTER — Encounter (HOSPITAL_COMMUNITY): Payer: Self-pay | Admitting: Obstetrics and Gynecology

## 2015-05-22 ENCOUNTER — Telehealth: Payer: Self-pay | Admitting: Obstetrics and Gynecology

## 2015-05-22 NOTE — Telephone Encounter (Signed)
Pt called stating that she needed a note to be out of work from yesterday until Monday. Pt did not have a phone number to fax the note to so she wanted us to mail her a copy of the note.

## 2015-05-22 NOTE — Telephone Encounter (Signed)
Note written and mailed to the pt per her request.

## 2015-05-28 ENCOUNTER — Encounter: Payer: Managed Care, Other (non HMO) | Admitting: Obstetrics and Gynecology

## 2015-05-29 ENCOUNTER — Encounter: Payer: Managed Care, Other (non HMO) | Admitting: Obstetrics and Gynecology

## 2015-06-05 ENCOUNTER — Ambulatory Visit (INDEPENDENT_AMBULATORY_CARE_PROVIDER_SITE_OTHER): Payer: Managed Care, Other (non HMO) | Admitting: Obstetrics and Gynecology

## 2015-06-05 ENCOUNTER — Encounter: Payer: Self-pay | Admitting: Obstetrics and Gynecology

## 2015-06-05 ENCOUNTER — Encounter: Payer: Managed Care, Other (non HMO) | Admitting: Obstetrics and Gynecology

## 2015-06-05 VITALS — BP 100/52 | HR 62 | Ht 62.5 in | Wt 171.0 lb

## 2015-06-05 DIAGNOSIS — Z319 Encounter for procreative management, unspecified: Secondary | ICD-10-CM

## 2015-06-05 DIAGNOSIS — Z9889 Other specified postprocedural states: Secondary | ICD-10-CM | POA: Diagnosis not present

## 2015-06-05 MED ORDER — OMEPRAZOLE 20 MG PO CPDR: 20.0000 mg | DELAYED_RELEASE_CAPSULE | Freq: Every day | ORAL | Status: DC

## 2015-06-05 NOTE — Progress Notes (Signed)
Patient ID: Mary Gilmore, female   DOB: 1970-07-05, 45 y.o.   MRN: [id number]   Subjective:  Mary Gilmore is a 45 y.o. female now 3 weeks status post D&C with Novasure ablasion.   Pt states she has had nausea, emesis, sore throat and productive cough with clear phlegm intermittently since the procedure. Pt reports adequate food and fluid intake. She reports mild relief of symptoms with Tums. Pt reports her throat pain is worsened with swallowing. Denies difficulty tolerating secretions, fever, chills. She also denies vaginal bleeding.   Review of Systems Negative except sore throat, cough, emesis, nausea    Diet:   Normal    Bowel movements : normal.  The patient is not having any pain.  Objective:  BP 100/52 mmHg  Pulse 62  Ht 5' 2.5" (1.588 m)  Wt 171 lb (77.565 kg)  BMI 30.76 kg/m2  LMP 05/18/2015 General:Well developed, well nourished.  No acute distress.  Discussion only   Assessment:  Post-Op 3  weeks s/p D&C with Novasure ablation   Chronic cough 3 weeks s/p endometrial ablation LMA History of GERD Doing adequately postoperatively.   Plan:  1.Wound care discussed   2. . current medications. Begin 20 mg Omeprazole  3. Activity restrictions: none 4. return to work: work Physicist, medicalletter provided. 5. Follow up in 3 weeks if unresolved.   By signing my name below, I, Doreatha MartinEva Mathews, attest that this documentation has been prepared under the direction and in the presence of Tilda BurrowJohn Candela Krul V, MD. Electronically Signed: Doreatha MartinEva Mathews, ED Scribe. 06/05/2015. 2:00 PM.  .I personally performed the services described in this documentation, which was SCRIBED in my presence. The recorded information has been reviewed and considered accurate. It has been edited as necessary during review. Tilda BurrowFERGUSON,Braelee Herrle V, MD

## 2017-03-19 ENCOUNTER — Encounter: Payer: Self-pay | Admitting: Women's Health

## 2017-03-19 ENCOUNTER — Other Ambulatory Visit (HOSPITAL_COMMUNITY)
Admission: RE | Admit: 2017-03-19 | Discharge: 2017-03-19 | Disposition: A | Discharge status: Home / Self Care | Payer: BLUE CROSS/BLUE SHIELD | Source: Ambulatory Visit | Attending: Obstetrics and Gynecology | Admitting: Obstetrics and Gynecology

## 2017-03-19 ENCOUNTER — Ambulatory Visit: Payer: BLUE CROSS/BLUE SHIELD | Admitting: Women's Health

## 2017-03-19 VITALS — BP 120/80 | HR 98 | Ht 63.0 in | Wt 168.0 lb

## 2017-03-19 DIAGNOSIS — N62 Hypertrophy of breast: Secondary | ICD-10-CM | POA: Diagnosis not present

## 2017-03-19 DIAGNOSIS — M549 Dorsalgia, unspecified: Secondary | ICD-10-CM | POA: Diagnosis not present

## 2017-03-19 DIAGNOSIS — M542 Cervicalgia: Secondary | ICD-10-CM | POA: Diagnosis not present

## 2017-03-19 DIAGNOSIS — Z01419 Encounter for gynecological examination (general) (routine) without abnormal findings: Secondary | ICD-10-CM

## 2017-03-19 DIAGNOSIS — M349 Systemic sclerosis, unspecified: Secondary | ICD-10-CM | POA: Insufficient documentation

## 2017-03-19 DIAGNOSIS — Z01411 Encounter for gynecological examination (general) (routine) with abnormal findings: Secondary | ICD-10-CM | POA: Diagnosis not present

## 2017-03-19 NOTE — Patient Instructions (Addendum)
Local plastic surgeons:  Dr. Benna DunksBarber [phone] Dr. Sherald Hessontogiannis [phone] [facility] [phone]

## 2017-03-19 NOTE — Progress Notes (Signed)
   WELL-WOMAN EXAMINATION Patient name: Mary AlySherry E Tawil MRN [id number]  Date of birth: 1970/05/03 Chief Complaint:   Gynecologic Exam (pap/physcial)  History of Present Illness:   Mary Gilmore is a 47 y.o. [phone] Caucasian female being seen today for a routine well-woman exam.  Current complaints: wants breast reduction, has scleroderma and large breasts pull on skin of upper chest causing pain. Also has upper back and neck pain. Bra digs into shoulders. Wears DDD.   PCP: Shelton SilvasJennifer Norman, [place]      does not desire labs, does want gc/ct on pap Patient's last menstrual period was 03/17/2017. The current method of family planning is s/p ablation Last pap 06/05/14. Results were: normal Last mammogram: never. Results were: does not want, states she wouldn't do anything if she had breast cancer Last colonoscopy: few years ago. Results were: normal  Review of Systems:   Pertinent items are noted in HPI Denies any headaches, blurred vision, fatigue, shortness of breath, chest pain, abdominal pain, abnormal vaginal discharge/itching/odor/irritation, problems with periods, bowel movements, urination, or intercourse unless otherwise stated above. Pertinent History Reviewed:  Reviewed past medical,surgical, social and family history.  Reviewed problem list, medications and allergies. Physical Assessment:   Vitals:   03/19/17 1229  BP: 120/80  Pulse: 98  Weight: 168 lb (76.2 kg)  Height: 5\' 3"  (1.6 m)  Body mass index is 29.76 kg/m.        Physical Examination:   General appearance - well appearing, and in no distress  Mental status - alert, oriented to person, place, and time  Psych:  She has a normal mood and affect  Skin - warm and dry, normal color, no suspicious lesions noted  Chest - effort normal, all lung fields clear to auscultation bilaterally  Heart - normal rate and regular rhythm  Neck:  midline trachea, no thyromegaly or nodules  Breasts - breasts appear normal, no  suspicious masses, no skin or nipple changes or  axillary nodes  Abdomen - soft, nontender, nondistended, no masses or organomegaly  Pelvic - VULVA: normal appearing vulva with no masses, tenderness or lesions  VAGINA: normal appearing vagina with normal color and discharge, no lesions  CERVIX: normal appearing cervix without discharge or lesions, no CMT  Thin prep pap is done w/ HR HPV cotesting  UTERUS: uterus is felt to be normal size, shape, consistency and nontender   ADNEXA: No adnexal masses or tenderness noted.  Extremities:  No swelling or varicosities noted  No results found for this or any previous visit (from the past 24 hour(s)).  Assessment & Plan:  1) Well-Woman Exam  2) Large breasts> w/ pain d/t scleroderma, as well as upper back and neck pain, wants breast reduction, numbers to plastic surgeons given  Labs/procedures today: pap w/ gc/ct  Mammogram-recommended, pt refuses, discussed undx breast cancer can be fatal, states she will let us know if she changes her mind  Colonoscopy @47yo  or sooner if problems  No orders of the defined types were placed in this encounter.   Follow-up: Return in about 1 year (around 03/19/2018) for Physical.  Cheral MarkerKimberly R Kieren Ricci CNM, WHNP-BC 03/19/2017 1:15 PM

## 2017-03-19 NOTE — Addendum Note (Signed)
Addended by: Federico FlakeNES, Almando Brawley A on: 03/19/2017 01:17 PM   Modules accepted: Orders

## 2017-03-22 ENCOUNTER — Telehealth: Payer: Self-pay | Admitting: Women's Health

## 2017-03-22 NOTE — Telephone Encounter (Signed)
Patient called stating that she would like a call back from a nurse, Pt states that she had a pap done on Friday and she states that she is bleeding and is very sore. Pt states that she has had Pap's before and she has never hurt or bleed. Please contact pt

## 2017-03-22 NOTE — Telephone Encounter (Signed)
Pt called stating that she has been bleeding and cramping after having her PAP done. She states that it was bright red but now is darker old blood. She states that she is still cramping. Advised taking Tylenol or ibuprofen for cramping. She states that she has done that but its not really helping. Offered pt an appt but she declined since she is no longer having bright red blood. Advised to call us back if s/s worsen or dont improve. Informed pt that results from test were not back yet. Pt verbalized understanding.

## 2017-03-23 LAB — CYTOLOGY - PAP
Chlamydia: NEGATIVE
Diagnosis: NEGATIVE
HPV: NOT DETECTED
Neisseria Gonorrhea: NEGATIVE

## 2017-03-25 ENCOUNTER — Telehealth: Payer: Self-pay | Admitting: Obstetrics & Gynecology

## 2017-03-25 ENCOUNTER — Telehealth: Payer: Self-pay | Admitting: Women's Health

## 2017-03-25 NOTE — Telephone Encounter (Signed)
Called patient back but unable to leave voicemail.

## 2017-03-25 NOTE — Telephone Encounter (Signed)
Pt called requesting lab results. DOB verified. Informed pt of normal pap and neg gc/chl.

## 2017-03-25 NOTE — Telephone Encounter (Signed)
Patient called back stating that she missed a phone call from someone, patient thinks it might be for her Pap results. Please contact pt

## 2017-04-16 ENCOUNTER — Other Ambulatory Visit: Payer: Managed Care, Other (non HMO) | Admitting: Obstetrics and Gynecology

## 2017-04-19 ENCOUNTER — Other Ambulatory Visit: Payer: Managed Care, Other (non HMO) | Admitting: Obstetrics and Gynecology

## 2017-08-27 ENCOUNTER — Ambulatory Visit: Payer: BLUE CROSS/BLUE SHIELD | Admitting: Obstetrics and Gynecology

## 2017-09-17 ENCOUNTER — Ambulatory Visit: Payer: BLUE CROSS/BLUE SHIELD | Admitting: Obstetrics and Gynecology

## 2017-09-17 ENCOUNTER — Encounter: Payer: Self-pay | Admitting: Obstetrics and Gynecology

## 2017-09-17 VITALS — BP 99/65 | HR 66 | Ht 63.0 in | Wt 182.0 lb

## 2017-09-17 DIAGNOSIS — N951 Menopausal and female climacteric states: Secondary | ICD-10-CM

## 2017-09-17 DIAGNOSIS — R55 Syncope and collapse: Secondary | ICD-10-CM | POA: Diagnosis not present

## 2017-09-17 MED ORDER — ESTRADIOL 0.1 MG/24HR TD PTTW: 1.0000 | MEDICATED_PATCH | TRANSDERMAL | 12 refills | Status: DC

## 2017-09-17 MED ORDER — PROGESTERONE MICRONIZED 200 MG PO CAPS: 200.0000 mg | ORAL_CAPSULE | Freq: Every day | ORAL | 1 refills | Status: DC

## 2017-09-17 NOTE — Progress Notes (Signed)
See above note

## 2017-09-17 NOTE — Progress Notes (Signed)
Patient ID: Mary AlySherry E Sann, female   DOB: 1970-06-05, 47 y.o.   MRN: [id number]    [facility]  @DATE @            Patient name: Mary Gilmore MRN [id number]  Date of birth: 1970-06-05  CC & HPI:  Mary Gilmore is a 47 y.o. female presenting today with 3 symptoms: hot flashes, weight gain, and hair loss.  1) hot flashes She had an ablation and has gone through many tests from February until now because she was told her heart was failing. Sometimes she gets out of the shower and sweats a lot and always gets her period about 2 days after this happens. She has to lay down during her hot flashes because she feels lightheaded. Her abdomen also swells when she sweats but when she starts her period, the sweating stops.  2) weight gain She watches what she eats but notes weight gain. She has incorporated turmeric and fish oil into her diet and has noticed a difference. Thyroid function tests are normal. She has scleroderma so she gets blood tests every 3 months and they are always normal.  3) hair loss She is losing hair from her head but grows hair on her face and gets waxes. Denies hair on nipples/breasts.  [place] but would like to relocate back to this area because her family is here. Her last PAP was on 03/19/2017 and was normal but she did call because she experienced vaginal bleeding after the pap. The patient denies fever, chills or any other symptoms or complaints at this time.   ROS:  ROS + hot flashes + lightheadedness + hair loss + weight gain + swollen abdomen + increased sex drive - fever - chills All systems are negative except as noted in the HPI and PMH.   Pertinent History Reviewed:   Reviewed:  Medical         Past Medical History:  Diagnosis Date  . Complication of anesthesia   . H/O scleroderma   . Headache   . Hypotension   . Menorrhagia 07/03/2013  . Patient desires pregnancy 07/03/2013  . PONV (postoperative nausea and vomiting)    . Raynaud's disease   . Unspecified symptom associated with female genital organs 07/01/2012                              Surgical Hx:    Past Surgical History:  Procedure Laterality Date  . DILITATION & CURRETTAGE/HYSTROSCOPY WITH NOVASURE ABLATION N/A 05/21/2015   Procedure: DILATATION, HYSTEROSCOPY WITH NOVASURE ENDOMETRIAL ABLATION; Uterine cavity length 5 cm; uterine cavity width = 4.3 cm; power =  118    watts; time = 1 minutes 48 seconds;  Surgeon: Tilda BurrowJohn Kalia Vahey V, MD;  Location: [facility];  Service: Gynecology;  Laterality: N/A;  . ELBOW ARTHROPLASTY Right    reconstruction  . MANDIBLE FRACTURE SURGERY    . RECTOCELE REPAIR    . TUBAL LIGATION    . tubal reversal  06/06/2012   Medications: Reviewed & Updated - see associated section                       Current Outpatient Medications:  Marland Kitchen.  Vitamin D, Ergocalciferol, (DRISDOL) 50000 units CAPS capsule, Take 50,000 Units by mouth every 30 (thirty) days., Disp: , Rfl:  .  cholecalciferol (VITAMIN D) 1000 UNITS tablet, Take 50 Units by  mouth daily. , Disp: , Rfl:  .  [START ON 09/20/2017] estradiol (VIVELLE-DOT) 0.1 MG/24HR patch, Place 1 patch (0.1 mg total) onto the skin 2 (two) times a week., Disp: 8 patch, Rfl: 12 .  omeprazole (PRILOSEC) 20 MG capsule, Take 1 capsule (20 mg total) by mouth daily., Disp: 30 capsule, Rfl: 2 .  progesterone (PROMETRIUM) 200 MG capsule, Take 1 capsule (200 mg total) by mouth daily. For 2 weeks per month, Disp: 42 capsule, Rfl: 1 .  vitamin C (ASCORBIC ACID) 500 MG tablet, Take 500 mg by mouth daily., Disp: , Rfl:  .  vitamin E 1000 UNIT capsule, Take 1,000 Units by mouth daily., Disp: , Rfl:   Social History: Reviewed -  reports that she quit smoking about 12 years ago. Her smoking use included cigarettes. She has a 4.00 pack-year smoking history. She has never used smokeless tobacco.  Objective Findings:  Vitals: Blood pressure 99/65, pulse 66, height 5\' 3"  (1.6 m), weight 182 lb (82.6 kg), last  menstrual period 09/11/2017.  PHYSICAL EXAMINATION General appearance - alert, well appearing, and in no distress and oriented to person, place, and time Mental status - alert, oriented to person, place, and time, normal mood, behavior, speech, dress, motor activity, and thought processes, affect appropriate to mood  PELVIC DEFERRED  Assessment & Plan:   A:  1. Perimenopausal 2. Vasomotor Instability 3    Scleroderma, stable on Tumeric, Fish oil P:  1. Rx Vivelle dot patches - [facility] 2. Rx Prometrium 3. F/U by phone or 1 year  By signing my name below, I, Pietro CassisEmily Tufford, attest that this documentation has been prepared under the direction and in the presence of Tilda BurrowFerguson, Brieonna Crutcher V, MD. Electronically Signed: Pietro CassisEmily Tufford, Medical Scribe. 09/17/17. 11:14 AM.  I personally performed the services described in this documentation, which was SCRIBED in my presence. The recorded information has been reviewed and considered accurate. It has been edited as necessary during review. Tilda BurrowJohn V Genese Quebedeaux, MD

## 2018-06-30 ENCOUNTER — Other Ambulatory Visit: Payer: Self-pay | Admitting: General Surgery

## 2018-06-30 ENCOUNTER — Other Ambulatory Visit: Payer: Self-pay | Admitting: Pediatrics

## 2018-06-30 ENCOUNTER — Other Ambulatory Visit (HOSPITAL_COMMUNITY): Payer: Self-pay | Admitting: Pediatrics

## 2018-06-30 DIAGNOSIS — R1011 Right upper quadrant pain: Secondary | ICD-10-CM

## 2018-07-01 ENCOUNTER — Encounter (HOSPITAL_COMMUNITY): Payer: Self-pay

## 2018-07-01 ENCOUNTER — Ambulatory Visit (HOSPITAL_COMMUNITY): Payer: BC Managed Care – PPO

## 2018-07-13 ENCOUNTER — Other Ambulatory Visit (HOSPITAL_COMMUNITY): Payer: Self-pay

## 2018-07-13 ENCOUNTER — Other Ambulatory Visit: Payer: Self-pay | Admitting: *Deleted

## 2018-07-13 DIAGNOSIS — I272 Pulmonary hypertension, unspecified: Secondary | ICD-10-CM

## 2018-07-18 ENCOUNTER — Telehealth: Payer: Self-pay | Admitting: Obstetrics and Gynecology

## 2018-07-18 NOTE — Telephone Encounter (Signed)
Patient called, having really bad swelling and cramping, vision is off too.  She is wanting an appointment.  She stated she seriously needs relief.  [facility]  [phone]

## 2018-07-19 NOTE — Telephone Encounter (Signed)
Pt reports that she is having menopausal symptoms, and feels like her hormones are acting up. Scheduled her for visit on 08/09/18. Patient would also like to have labs done for menopause and hormones. Advised I would send message to Dr. Glo Herring and ask him to order.

## 2018-07-19 NOTE — Telephone Encounter (Signed)
I will order serum FSH and Estradiol when at the office tomorrow.

## 2018-07-20 ENCOUNTER — Other Ambulatory Visit (HOSPITAL_COMMUNITY): Payer: BC Managed Care – PPO

## 2018-07-21 ENCOUNTER — Telehealth: Payer: Self-pay | Admitting: Obstetrics and Gynecology

## 2018-07-21 NOTE — Telephone Encounter (Signed)

## 2018-07-25 ENCOUNTER — Other Ambulatory Visit: Payer: BC Managed Care – PPO

## 2018-07-27 ENCOUNTER — Other Ambulatory Visit: Payer: Self-pay | Admitting: Rheumatology

## 2018-07-27 DIAGNOSIS — I272 Pulmonary hypertension, unspecified: Secondary | ICD-10-CM

## 2018-08-09 ENCOUNTER — Ambulatory Visit: Payer: BC Managed Care – PPO | Admitting: Obstetrics and Gynecology

## 2018-08-11 ENCOUNTER — Other Ambulatory Visit: Payer: Self-pay

## 2018-08-11 ENCOUNTER — Ambulatory Visit (INDEPENDENT_AMBULATORY_CARE_PROVIDER_SITE_OTHER): Payer: BC Managed Care – PPO

## 2018-08-11 ENCOUNTER — Encounter (INDEPENDENT_AMBULATORY_CARE_PROVIDER_SITE_OTHER): Payer: Self-pay

## 2018-08-11 DIAGNOSIS — I272 Pulmonary hypertension, unspecified: Secondary | ICD-10-CM

## 2018-08-16 ENCOUNTER — Telehealth: Payer: Self-pay | Admitting: Cardiology

## 2018-08-16 NOTE — Telephone Encounter (Signed)
Patient called stating that she had an Echo last week.  Her PCP Dr Mariea Clonts has not received a copy of results    Please fax to [phone]

## 2018-08-16 NOTE — Telephone Encounter (Signed)
Echo faxed to Dr. Mariea Clonts [phone]

## 2019-05-12 ENCOUNTER — Other Ambulatory Visit: Payer: BC Managed Care – PPO | Admitting: Obstetrics and Gynecology

## 2019-06-08 NOTE — Progress Notes (Signed)
PATIENT ID: Mary Gilmore, female     DOB: 09/18/1970, 49 y.o.     MRN: [id number]   .   [facility] Visit  06/08/19          Patient name: Mary Gilmore MRN [id number]  Date of birth: 1970/07/20  CC & HPI:  Mary Gilmore is a 49 y.o. female presenting today for a pap smear and physical exam .     ROS:  Review of Systems  Constitutional: Negative for diaphoresis, fever, malaise/fatigue and weight loss.  HENT: Negative for congestion and sore throat.   Eyes: Negative for blurred vision and double vision.  Respiratory: Negative for cough and shortness of breath.   Cardiovascular: Negative for chest pain, palpitations and leg swelling.  Gastrointestinal: Negative for constipation, diarrhea, nausea and vomiting.  Genitourinary: Negative for frequency and urgency.  Musculoskeletal: Negative for back pain, falls and myalgias.  Skin: Negative for rash.  Neurological: Negative for dizziness, weakness and headaches.  Psychiatric/Behavioral: Negative for depression. The patient is not nervous/anxious.   She did not get mammograms.  Has been encouraged to do so.  She does breast self exam and there is an area of stable fullness about 3 cm in both breasts in the upper outer quadrants, slightly more prominent on the left which is been there times years.  Its soft mobile and become sore with her cycle, most consistent with breast tissue.  Once again I encourage mammography Her family are choosing not to get vaccinations for Covid though she has antibodies, her 55 year old grandmother survived Covid without difficulty No menses   Pertinent History Reviewed:  Reviewed: Significant for no   Medical         Past Medical History:  Diagnosis Date  . Complication of anesthesia   . H/O scleroderma   . Headache   . Hypotension   . Menorrhagia 07/03/2013  . Patient desires pregnancy 07/03/2013  . PONV (postoperative nausea and vomiting)   . Raynaud's disease   . Unspecified symptom  associated with female genital organs 07/01/2012                              Surgical Hx:    Past Surgical History:  Procedure Laterality Date  . DILITATION & CURRETTAGE/HYSTROSCOPY WITH NOVASURE ABLATION N/A 05/21/2015   Procedure: DILATATION, HYSTEROSCOPY WITH NOVASURE ENDOMETRIAL ABLATION; Uterine cavity length 5 cm; uterine cavity width = 4.3 cm; power =  118    watts; time = 1 minutes 48 seconds;  Surgeon: Jonnie Kind, MD;  Location: [facility];  Service: Gynecology;  Laterality: N/A;  . ELBOW ARTHROPLASTY Right    reconstruction  . MANDIBLE FRACTURE SURGERY    . RECTOCELE REPAIR    . TUBAL LIGATION    . tubal reversal  06/06/2012   Medications: Reviewed & Updated - see associated section                       Current Outpatient Medications:  .  cholecalciferol (VITAMIN D) 1000 UNITS tablet, Take 50 Units by mouth daily. , Disp: , Rfl:  .  estradiol (VIVELLE-DOT) 0.1 MG/24HR patch, Place 1 patch (0.1 mg total) onto the skin 2 (two) times a week., Disp: 8 patch, Rfl: 12 .  omeprazole (PRILOSEC) 20 MG capsule, Take 1 capsule (20 mg total) by mouth daily., Disp: 30 capsule, Rfl: 2 .  progesterone (PROMETRIUM) 200 MG capsule,  Take 1 capsule (200 mg total) by mouth daily. For 2 weeks per month, Disp: 42 capsule, Rfl: 1 .  vitamin C (ASCORBIC ACID) 500 MG tablet, Take 500 mg by mouth daily., Disp: , Rfl:  .  Vitamin D, Ergocalciferol, (DRISDOL) 50000 units CAPS capsule, Take 50,000 Units by mouth every 30 (thirty) days., Disp: , Rfl:  .  vitamin E 1000 UNIT capsule, Take 1,000 Units by mouth daily., Disp: , Rfl:    Social History: Reviewed -  reports that she quit smoking about 14 years ago. Her smoking use included cigarettes. She has a 4.00 pack-year smoking history. She has never used smokeless tobacco. Married, son has moved back from college, living with she and her husband, they have a place in [place], She reports that she does not sleep as well as she used to, but has not noticed  any reduced libido.  regular sexual activity  works at [employer] full-time she is concerned about weight gain as her weight now TOPS 184.  Previously phentermine assisted with weight loss very effectively, patient has had a recent cardiac evaluation that was negative.  She requests phentermine.  We will give her 30 days and she will report her weight after 30 days and I can refill it Objective Findings:  Vitals: There were no vitals taken for this visit. BP 105/67 (BP Location: Right Arm, Patient Position: Sitting, Cuff Size: Normal)   Pulse 61   Ht 5\' 3"  (1.6 m)   Wt 184 lb 6.4 oz (83.6 kg)   BMI 32.66 kg/m   PHYSICAL EXAMINATION General appearance - alert, well appearing, and in no distress, oriented to person, place, and time and overweight Mental status - alert, oriented to person, place, and time, normal mood, behavior, speech, dress, motor activity, and thought processes Chest - clear to auscultation, no wheezes, rales or rhonchi, symmetric air entry, unlabored breathing Heart - normal rate and regular rhythm recent echo normal Abdomen - soft, nontender, nondistended, no masses or organomegaly Breasts - breasts appear normal, no suspicious masses, no skin or nipple changes or axillary nodes, unchanged from previous exams, as mentioned above there is a smooth uniform 4 cm mobile area in the upper outer quadrant of the left breast slightly more pronounced than the right.  The patient is fully aware of this is been present for years.  There is no dimpling retraction pill.  The lymph nodes are negative.  Mammogram is encouraged annually, and patient is skeptical about the need for mammograms.  I encouraged her to realize that these pick up things that self-exam cannot detect. Skin - normal coloration and turgor, no rashes, no suspicious skin lesions noted  PELVIC    Vulva normal    Vagina -normal secretions Cervix -large multiparous cervix 3 to 3-1/2 cm transverse diameter adequate support   Uterus -tiny mobile anterior Adnexa -minimal tenderness over the uterus otherwise normal [place] -not applicable Rectal - normal rectal, no masses, no rectocele Hemoccult negative    Assessment & Plan:   A:  1. Well woman exam and with normal pelvic 2. Unchanged breast fullness upper outer quadrants bilaterally left greater than right  P:  1. Mammogram encouraged now and annually 2. Pap smear done 3. Repeat Pap every 3 to 5 years    By signing my name below, I, , attest that this documentation has been prepared under the direction and in the presence of [employer], MD. Electronically Signed: [name] Medical Scribe. 06/08/19. 11:11 PM.  I personally performed the services described in this documentation, which was SCRIBED in my presence. The recorded information has been reviewed and considered accurate. It has been edited as necessary during review. Jonnie Kind, MD

## 2019-06-08 NOTE — Progress Notes (Signed)
PBy signing my name below, I, Nikki Dom, attest that this documentation has been prepared under the direction and in the presence of [name], MD. Electronically Signed: [employer]. 06/08/19. 9:45 PM.  Please see my note which is a cc of Melia cons preparatory note

## 2019-06-09 ENCOUNTER — Other Ambulatory Visit (HOSPITAL_COMMUNITY)
Admission: RE | Admit: 2019-06-09 | Discharge: 2019-06-09 | Disposition: A | Discharge status: Home / Self Care | Payer: BC Managed Care – PPO | Source: Ambulatory Visit | Attending: Obstetrics and Gynecology | Admitting: Obstetrics and Gynecology

## 2019-06-09 ENCOUNTER — Other Ambulatory Visit: Payer: Self-pay

## 2019-06-09 ENCOUNTER — Encounter: Payer: Self-pay | Admitting: Obstetrics and Gynecology

## 2019-06-09 ENCOUNTER — Ambulatory Visit (INDEPENDENT_AMBULATORY_CARE_PROVIDER_SITE_OTHER): Payer: BC Managed Care – PPO | Admitting: Obstetrics and Gynecology

## 2019-06-09 VITALS — BP 105/67 | HR 61 | Ht 63.0 in | Wt 184.4 lb

## 2019-06-09 DIAGNOSIS — Z01419 Encounter for gynecological examination (general) (routine) without abnormal findings: Secondary | ICD-10-CM | POA: Diagnosis not present

## 2019-06-09 MED ORDER — PHENTERMINE HCL 37.5 MG PO CAPS: 37.5000 mg | ORAL_CAPSULE | ORAL | 0 refills | Status: DC

## 2019-06-13 LAB — CYTOLOGY - PAP
Comment: NEGATIVE
Diagnosis: NEGATIVE
High risk HPV: NEGATIVE

## 2019-07-07 ENCOUNTER — Other Ambulatory Visit: Payer: Self-pay

## 2019-07-07 MED ORDER — PHENTERMINE HCL 37.5 MG PO CAPS: 37.5000 mg | ORAL_CAPSULE | ORAL | 0 refills | Status: DC

## 2019-12-24 ENCOUNTER — Other Ambulatory Visit: Payer: Self-pay

## 2019-12-24 ENCOUNTER — Ambulatory Visit
Admission: EM | Admit: 2019-12-24 | Discharge: 2019-12-24 | Disposition: A | Discharge status: Home / Self Care | Payer: BC Managed Care – PPO | Attending: Emergency Medicine | Admitting: Emergency Medicine

## 2019-12-24 ENCOUNTER — Ambulatory Visit (INDEPENDENT_AMBULATORY_CARE_PROVIDER_SITE_OTHER): Payer: BC Managed Care – PPO

## 2019-12-24 ENCOUNTER — Ambulatory Visit: Payer: BC Managed Care – PPO

## 2019-12-24 ENCOUNTER — Encounter: Payer: Self-pay | Admitting: Emergency Medicine

## 2019-12-24 DIAGNOSIS — R059 Cough, unspecified: Secondary | ICD-10-CM | POA: Diagnosis not present

## 2019-12-24 DIAGNOSIS — J069 Acute upper respiratory infection, unspecified: Secondary | ICD-10-CM | POA: Diagnosis not present

## 2019-12-24 MED ORDER — AZITHROMYCIN 250 MG PO TABS
250.0000 mg | ORAL_TABLET | Freq: Every day | ORAL | 0 refills | Status: DC
Start: 2019-12-24 — End: 2020-06-21

## 2019-12-24 NOTE — ED Provider Notes (Signed)
[facility]   [id number] 12/24/19 Arrival Time: 0907   CC: COVID symptoms  SUBJECTIVE: History from: patient and family.  Mary Gilmore is a 49 y.o. female who presents to the urgent care with a complaint of fatigue, nasal congestion and coughing brown nasal discharge for the past 3 days.  Denies sick exposure to COVID, flu or strep or TB.  Denies recent travel.  Has tried OTC medication with no relief.  Denies aggravating factors.  Denies previous symptoms in the past.   Denies fever, sinus pain, rhinorrhea, sore throat, SOB, wheezing, chest pain, nausea, changes in bowel or bladder habits.    ROS: As per HPI.  All other pertinent ROS negative.      Past Medical History:  Diagnosis Date  . Complication of anesthesia   . H/O scleroderma   . Headache   . Hypotension   . Menorrhagia 07/03/2013  . Patient desires pregnancy 07/03/2013  . PONV (postoperative nausea and vomiting)   . Raynaud's disease   . Unspecified symptom associated with female genital organs 07/01/2012   Past Surgical History:  Procedure Laterality Date  . DILITATION & CURRETTAGE/HYSTROSCOPY WITH NOVASURE ABLATION N/A 05/21/2015   Procedure: DILATATION, HYSTEROSCOPY WITH NOVASURE ENDOMETRIAL ABLATION; Uterine cavity length 5 cm; uterine cavity width = 4.3 cm; power =  118    watts; time = 1 minutes 48 seconds;  Surgeon: [name], MD;  Location: [facility];  Service: Gynecology;  Laterality: N/A;  . ELBOW ARTHROPLASTY Right    reconstruction  . MANDIBLE FRACTURE SURGERY    . RECTOCELE REPAIR    . TUBAL LIGATION    . tubal reversal  06/06/2012   Allergies  Allergen Reactions  . Codeine Shortness Of Breath    Pt states suppresses breathing  . Other Anaphylaxis    ANY VACCINE   . Adhesive [Tape]   . Morphine And Related Hives   No current facility-administered medications on file prior to encounter.   Current Outpatient Medications on File Prior to Encounter  Medication Sig Dispense Refill  .  cholecalciferol (VITAMIN D) 1000 UNITS tablet Take 50 Units by mouth daily.     Marland Kitchen estradiol (VIVELLE-DOT) 0.1 MG/24HR patch Place 1 patch (0.1 mg total) onto the skin 2 (two) times a week. 8 patch 12  . omeprazole (PRILOSEC) 20 MG capsule Take 1 capsule (20 mg total) by mouth daily. 30 capsule 2  . phentermine 37.5 MG capsule Take 1 capsule (37.5 mg total) by mouth every morning. 2 hours before breakfast with 8 ounces water 30 capsule 0  . progesterone (PROMETRIUM) 200 MG capsule Take 1 capsule (200 mg total) by mouth daily. For 2 weeks per month 42 capsule 1  . vitamin C (ASCORBIC ACID) 500 MG tablet Take 500 mg by mouth daily.    . Vitamin D, Ergocalciferol, (DRISDOL) 50000 units CAPS capsule Take 50,000 Units by mouth every 30 (thirty) days.    . vitamin E 1000 UNIT capsule Take 1,000 Units by mouth daily.     Social History   Socioeconomic History  . Marital status: Married    Spouse name: Not on file  . Number of children: 2  . Years of education: Not on file  . Highest education level: Not on file  Occupational History  . Not on file  Tobacco Use  . Smoking status: Former Smoker    Packs/day: 0.25    Years: 16.00    Pack years: 4.00    Types: Cigarettes  Quit date: 05/19/2005    Years since quitting: 14.6  . Smokeless tobacco: Never Used  Substance and Sexual Activity  . Alcohol use: No  . Drug use: No    Types: Marijuana  . Sexual activity: Yes    Birth control/protection: Surgical    Comment:  ablation  Other Topics Concern  . Not on file  Social History Narrative  . Not on file   Social Determinants of Health   Financial Resource Strain: Unknown  . Difficulty of Paying Living Expenses: Patient refused  Food Insecurity: Unknown  . Worried About Programme researcher, broadcasting/film/video in the Last Year: Patient refused  . Ran Out of Food in the Last Year: Patient refused  Transportation Needs: Unknown  . Lack of Transportation (Medical): Patient refused  . Lack of Transportation  (Non-Medical): Patient refused  Physical Activity: Unknown  . Days of Exercise per Week: Not on file  . Minutes of Exercise per Session: Patient refused  Stress: Unknown  . Feeling of Stress : Patient refused  Social Connections: Unknown  . Frequency of Communication with Friends and Family: Patient refused  . Frequency of Social Gatherings with Friends and Family: Patient refused  . Attends Religious Services: Patient refused  . Active Member of Clubs or Organizations: Not on file  . Attends Banker Meetings: Patient refused  . Marital Status: Patient refused  Intimate Partner Violence: Unknown  . Fear of Current or Ex-Partner: Patient refused  . Emotionally Abused: Patient refused  . Physically Abused: Patient refused  . Sexually Abused: Patient refused   Family History  Problem Relation Age of Onset  . Heart disease Father        atrial fib  . Cancer Brother        hodgkins  . ALS Mother   . Diabetes Other   . Hypertension Other     OBJECTIVE:  Vitals:   12/24/19 0924 12/24/19 0925  BP: 114/78   Pulse: (!) 105   Resp: 19   Temp: 98.3 F (36.8 C)   TempSrc: Oral   SpO2: 98%   Weight:  160 lb (72.6 kg)  Height:  5\' 3"  (1.6 m)     General appearance: alert; appears fatigued, but nontoxic; speaking in full sentences and tolerating own secretions HEENT: NCAT; Ears: EACs clear, TMs pearly gray; Eyes: PERRL.  EOM grossly intact. Sinuses: nontender; Nose: nares patent without rhinorrhea, Throat: oropharynx clear, tonsils non erythematous or enlarged, uvula midline  Neck: supple without LAD Lungs: unlabored respirations, symmetrical air entry; cough: moderate; no respiratory distress; CTAB Heart: regular rate and rhythm.  Radial pulses 2+ symmetrical bilaterally Skin: warm and dry Psychological: alert and cooperative; normal mood and affect  LABS:  No results found for this or any previous visit (from the past 24 hour(s)).   RADIOLOGY  DG Chest 2  View  Result Date: 12/24/2019 CLINICAL DATA:  Cough. EXAM: CHEST - 2 VIEW COMPARISON:  None. FINDINGS: Lungs are adequately inflated and otherwise clear. Cardiomediastinal silhouette is normal. Bones and soft tissues are normal. IMPRESSION: No active cardiopulmonary disease. Electronically Signed   By: 12/26/2019 M.D.   On: 12/24/2019 10:24    Chest X-ray is negative for acute cardiopulmonary disease.  I have reviewed the x-ray myself and the radiologist interpretation.  I am in agreement with the radiologist interpretation.  ASSESSMENT & PLAN:  1. URI, acute     Meds ordered this encounter  Medications  . azithromycin (ZITHROMAX) 250 MG tablet  Sig: Take 1 tablet (250 mg total) by mouth daily. Take first 2 tablets together, then 1 every day until finished.    Dispense:  6 tablet    Refill:  0    Discharge instructions  Get plenty of rest and push fluids Tessalon Perles prescribed for cough Continue zyrtec for nasal congestion, runny nose, and/or sore throat Continue flonase for nasal congestion and runny nose Azithromycin was prescribed/take as directed Use medications daily for symptom relief Use OTC medications like ibuprofen or tylenol as needed fever or pain Call or go to the ED if you have any new or worsening symptoms such as fever, worsening cough, shortness of breath, chest tightness, chest pain, turning blue, changes in mental status, etc...   Reviewed expectations re: course of current medical issues. Questions answered. Outlined signs and symptoms indicating need for more acute intervention. Patient verbalized understanding. After Visit Summary given.         Durward Parcel, FNP 12/24/19 1038

## 2019-12-24 NOTE — Discharge Instructions (Signed)
Get plenty of rest and push fluids Tessalon Perles prescribed for cough Continue zyrtec for nasal congestion, runny nose, and/or sore throat Continue flonase for nasal congestion and runny nose Azithromycin was prescribed/take as directed Use medications daily for symptom relief Use OTC medications like ibuprofen or tylenol as needed fever or pain Call or go to the ED if you have any new or worsening symptoms such as fever, worsening cough, shortness of breath, chest tightness, chest pain, turning blue, changes in mental status, etc..Marland Kitchen

## 2019-12-24 NOTE — ED Triage Notes (Addendum)
Congestion, fatigue, coughing up blood since Friday.  Pt states she thinks she has pne, has had it in the past and she had the same s/s.  Does not want covid test.

## 2020-02-09 ENCOUNTER — Ambulatory Visit
Admission: EM | Admit: 2020-02-09 | Discharge: 2020-02-09 | Disposition: A | Discharge status: Home / Self Care | Payer: BC Managed Care – PPO | Attending: Family Medicine | Admitting: Family Medicine

## 2020-02-09 ENCOUNTER — Other Ambulatory Visit: Payer: Self-pay

## 2020-02-09 ENCOUNTER — Encounter: Payer: Self-pay | Admitting: Emergency Medicine

## 2020-02-09 DIAGNOSIS — R002 Palpitations: Secondary | ICD-10-CM

## 2020-02-09 DIAGNOSIS — Z1152 Encounter for screening for COVID-19: Secondary | ICD-10-CM

## 2020-02-09 DIAGNOSIS — M349 Systemic sclerosis, unspecified: Secondary | ICD-10-CM

## 2020-02-09 DIAGNOSIS — Z01812 Encounter for preprocedural laboratory examination: Secondary | ICD-10-CM | POA: Diagnosis not present

## 2020-02-09 DIAGNOSIS — Z20822 Contact with and (suspected) exposure to covid-19: Secondary | ICD-10-CM

## 2020-02-09 NOTE — Discharge Instructions (Signed)
EKG was normal in the office today  Your COVID and Flu tests are pending. You should self quarantine until the test results are back.    Take Tylenol or ibuprofen as needed for fever or discomfort. Rest and keep yourself hydrated.    Follow-up with your primary care provider if your symptoms are not improving.

## 2020-02-09 NOTE — ED Provider Notes (Signed)
[facility]    CSN: [id number] Arrival date & time: 02/09/20  0913      History   Chief Complaint Chief Complaint  Patient presents with  . Chest Pain    HPI Mary Gilmore is a 50 y.o. female.   Reports that she has been having palpitations since last night. Has also been experiencing cough, rhinorrhea and nasal congestion. Has not attempted OTC treatment. There are no aggravating or alleviating factors. Has not had this issue in the past. Has hx hypotension, scleroderma. Requesting Covid testing today. Reports sick contacts. Has negative hx Covid. Has not completed Covid vaccines. Has not completed flu vaccine. Denies nausea, vomiting, diarrhea, rash, fever, other symptoms.  ROS per HPI  The history is provided by the patient.  Chest Pain   Past Medical History:  Diagnosis Date  . Complication of anesthesia   . H/O scleroderma   . Headache   . Hypotension   . Menorrhagia 07/03/2013  . Patient desires pregnancy 07/03/2013  . PONV (postoperative nausea and vomiting)   . Raynaud's disease   . Unspecified symptom associated with female genital organs 07/01/2012    Patient Active Problem List   Diagnosis Date Noted  . Scleroderma (HCC) 03/19/2017  . Chronic pelvic pain in female 04/30/2015  . Menorrhagia 07/03/2013  . Unspecified symptom associated with female genital organs 07/01/2012    Past Surgical History:  Procedure Laterality Date  . DILITATION & CURRETTAGE/HYSTROSCOPY WITH NOVASURE ABLATION N/A 05/21/2015   Procedure: DILATATION, HYSTEROSCOPY WITH NOVASURE ENDOMETRIAL ABLATION; Uterine cavity length 5 cm; uterine cavity width = 4.3 cm; power =  118    watts; time = 1 minutes 48 seconds;  Surgeon: [name], MD;  Location: [facility];  Service: Gynecology;  Laterality: N/A;  . ELBOW ARTHROPLASTY Right    reconstruction  . MANDIBLE FRACTURE SURGERY    . RECTOCELE REPAIR    . TUBAL LIGATION    . tubal reversal  06/06/2012    OB History    Gravida   3   Para  2   Term      Preterm      AB  1   Living  2     SAB  1   IAB      Ectopic      Multiple      Live Births  2            Home Medications    Prior to Admission medications   Medication Sig Start Date End Date Taking? Authorizing Provider  azithromycin (ZITHROMAX) 250 MG tablet Take 1 tablet (250 mg total) by mouth daily. Take first 2 tablets together, then 1 every day until finished. 12/24/19   Avegno, Zachery Dakins, FNP  cholecalciferol (VITAMIN D) 1000 UNITS tablet Take 50 Units by mouth daily.     [provider]  estradiol (VIVELLE-DOT) 0.1 MG/24HR patch Place 1 patch (0.1 mg total) onto the skin 2 (two) times a week. 09/20/17   [name], MD  omeprazole (PRILOSEC) 20 MG capsule Take 1 capsule (20 mg total) by mouth daily. 06/05/15   [name], MD  phentermine 37.5 MG capsule Take 1 capsule (37.5 mg total) by mouth every morning. 2 hours before breakfast with 8 ounces water 07/07/19   [name], MD  progesterone (PROMETRIUM) 200 MG capsule Take 1 capsule (200 mg total) by mouth daily. For 2 weeks per month 09/17/17   [name], MD  vitamin C (ASCORBIC ACID) 500 MG tablet Take 500 mg by mouth daily.    [provider]  Vitamin D, Ergocalciferol, (DRISDOL) 50000 units CAPS capsule Take 50,000 Units by mouth every 30 (thirty) days.    [provider]  vitamin E 1000 UNIT capsule Take 1,000 Units by mouth daily.    [provider]    Family History Family History  Problem Relation Age of Onset  . Heart disease Father        atrial fib  . Cancer Brother        hodgkins  . ALS Mother   . Diabetes Other   . Hypertension Other     Social History Social History   Tobacco Use  . Smoking status: Former Smoker    Packs/day: 0.25    Years: 16.00    Pack years: 4.00    Types: Cigarettes    Quit date: 05/19/2005    Years since quitting: 14.7  . Smokeless tobacco: Never Used  Substance Use Topics   . Alcohol use: No  . Drug use: No    Types: Marijuana     Allergies   Codeine, Other, Adhesive [tape], and Morphine and related   Review of Systems Review of Systems  Cardiovascular: Positive for chest pain.     Physical Exam Triage Vital Signs ED Triage Vitals [02/09/20 0922]  Enc Vitals Group     BP      Pulse      Resp      Temp      Temp src      SpO2      Weight 158 lb 11.7 oz (72 kg)     Height 5\' 3"  (1.6 m)     Head Circumference      Peak Flow      Pain Score 0     Pain Loc      Pain Edu?      Excl. in [place]?    No data found.  Updated Vital Signs BP 106/72 (BP Location: Right Arm)   Pulse 96   Temp 98.2 F (36.8 C) (Oral)   Resp 18   Ht 5\' 3"  (1.6 m)   Wt 158 lb 11.7 oz (72 kg)   LMP 09/11/2017   SpO2 98%   BMI 28.12 kg/m     Physical Exam Vitals and nursing note reviewed.  Constitutional:      General: She is not in acute distress.    Appearance: She is well-developed and well-nourished.  HENT:     Head: Normocephalic and atraumatic.     Right Ear: Tympanic membrane normal.     Left Ear: Tympanic membrane normal.     Nose: Congestion and rhinorrhea present.     Mouth/Throat:     Mouth: Mucous membranes are moist.     Pharynx: Oropharynx is clear. Posterior oropharyngeal erythema present.  Eyes:     Extraocular Movements: Extraocular movements intact.     Conjunctiva/sclera: Conjunctivae normal.     Pupils: Pupils are equal, round, and reactive to light.  Cardiovascular:     Rate and Rhythm: Normal rate and regular rhythm.     Heart sounds: Normal heart sounds. No murmur heard.   Pulmonary:     Effort: Pulmonary effort is normal. No respiratory distress.     Breath sounds: Wheezing present. No decreased breath sounds, rhonchi or rales.  Chest:     Chest wall: No mass, deformity, tenderness, crepitus or edema. There is  no dullness to percussion.  Abdominal:     Palpations: Abdomen is soft.     Tenderness: There is no abdominal  tenderness.  Musculoskeletal:        General: No edema. Normal range of motion.     Cervical back: Normal range of motion and neck supple.  Skin:    General: Skin is warm and dry.     Capillary Refill: Capillary refill takes less than 2 seconds.  Neurological:     General: No focal deficit present.     Mental Status: She is alert and oriented to person, place, and time.  Psychiatric:        Mood and Affect: Mood and affect and mood normal.        Behavior: Behavior normal.      UC Treatments / Results  Labs (all labs ordered are listed, but only abnormal results are displayed) Labs Reviewed  COVID-19, FLU A+B NAA    EKG   Radiology No results found.  Procedures Procedures (including critical care time)  Medications Ordered in UC Medications - No data to display  Initial Impression / Assessment and Plan / UC Course  I have reviewed the triage vital signs and the nursing notes.  Pertinent labs & imaging results that were available during my care of the patient were reviewed by me and considered in my medical decision making (see chart for details).    Palpitations Scleroderma Covid Screen  EKG NSR in office today Given symptoms, medical history and clinical presentation: discussed that patient would be best served in the ER Declines today, states that she has rheumatology appt this week Covid swab obtained in office today.   Patient instructed to quarantine until results are back and negative.   If results are negative, patient may resume daily schedule as tolerated once they are fever free for 24 hours without the use of antipyretic medications.   If results are positive, patient instructed to quarantine for at least 5 days from symptom onset.  If after 5 days symptoms have resolved, may return to work with a well fitting mask for the next 5 days. If symptomatic after day 5, isolation should be extended to 10 days. Patient instructed to follow-up with primary  care or with this office as needed.   Patient instructed to follow-up in the ER for trouble swallowing, trouble breathing, other concerning symptoms.   Final Clinical Impressions(s) / UC Diagnoses   Final diagnoses:  Palpitations  Scleroderma (HCC)  Encounter for screening for COVID-19     Discharge Instructions     EKG was normal in the office today  Your COVID and Flu tests are pending. You should self quarantine until the test results are back.    Take Tylenol or ibuprofen as needed for fever or discomfort. Rest and keep yourself hydrated.    Follow-up with your primary care provider if your symptoms are not improving.        ED Prescriptions    None     PDMP not reviewed this encounter.   Moshe Cipro, NP 02/11/20 2009

## 2020-02-09 NOTE — ED Triage Notes (Signed)
Pt states she feels like she is having palpitations that started yesterday evening.

## 2020-02-14 LAB — COVID-19, FLU A+B NAA
Influenza A, NAA: NOT DETECTED
Influenza B, NAA: NOT DETECTED
SARS-CoV-2, NAA: NOT DETECTED

## 2020-03-04 ENCOUNTER — Ambulatory Visit: Payer: BC Managed Care – PPO | Admitting: Obstetrics & Gynecology

## 2020-05-08 ENCOUNTER — Other Ambulatory Visit: Payer: Self-pay

## 2020-05-08 DIAGNOSIS — I272 Pulmonary hypertension, unspecified: Secondary | ICD-10-CM

## 2020-05-23 ENCOUNTER — Other Ambulatory Visit: Payer: Self-pay | Admitting: Pediatrics

## 2020-05-23 DIAGNOSIS — I272 Pulmonary hypertension, unspecified: Secondary | ICD-10-CM

## 2020-05-30 ENCOUNTER — Ambulatory Visit: Payer: BC Managed Care – PPO | Admitting: Adult Health

## 2020-06-12 ENCOUNTER — Ambulatory Visit (INDEPENDENT_AMBULATORY_CARE_PROVIDER_SITE_OTHER): Payer: BC Managed Care – PPO

## 2020-06-12 DIAGNOSIS — I272 Pulmonary hypertension, unspecified: Secondary | ICD-10-CM

## 2020-06-12 LAB — ECHOCARDIOGRAM COMPLETE
AR max vel: 1.79 cm2
AV Area VTI: 2.13 cm2
AV Area mean vel: 1.91 cm2
AV Mean grad: 2.5 mmHg
AV Peak grad: 5.2 mmHg
Ao pk vel: 1.14 m/s
Area-P 1/2: 4.8 cm2
Calc EF: 63 %
MV M vel: 2.78 m/s
MV Peak grad: 30.9 mmHg
S' Lateral: 2.68 cm
Single Plane A2C EF: 64.7 %
Single Plane A4C EF: 61.5 %

## 2020-06-21 ENCOUNTER — Ambulatory Visit (INDEPENDENT_AMBULATORY_CARE_PROVIDER_SITE_OTHER): Payer: BC Managed Care – PPO | Admitting: Adult Health

## 2020-06-21 ENCOUNTER — Encounter: Payer: Self-pay | Admitting: Adult Health

## 2020-06-21 ENCOUNTER — Other Ambulatory Visit: Payer: Self-pay

## 2020-06-21 VITALS — BP 110/70 | HR 74 | Ht 63.0 in | Wt 181.0 lb

## 2020-06-21 DIAGNOSIS — R102 Pelvic and perineal pain: Secondary | ICD-10-CM | POA: Insufficient documentation

## 2020-06-21 DIAGNOSIS — N6452 Nipple discharge: Secondary | ICD-10-CM | POA: Insufficient documentation

## 2020-06-21 LAB — POCT URINALYSIS DIPSTICK
Blood, UA: NEGATIVE
Glucose, UA: NEGATIVE
Leukocytes, UA: NEGATIVE
Nitrite, UA: NEGATIVE
Protein, UA: NEGATIVE

## 2020-06-21 NOTE — Progress Notes (Signed)
  Subjective:     Patient ID: Mary Gilmore, female   DOB: [birth year], 50 y.o.   MRN: [id number]  HPI Mary Gilmore is a 50 year old white female, married, [id number], in complaining of bilateral breast discharge for about 5 months, from multiple sites, and looks like milk, no pain. Has pelvic pressure and urinary frequency. She had ablation but spots occasionally for last 6 months. PCP is Dr Mary Gilmore  Review of Systems Has noticed breast discharge,see HPI Has pelvic pressure and urinary frequency Reviewed past medical,surgical, social and family history. Reviewed medications and allergies.     Objective:   Physical Exam BP 110/70 (BP Location: Right Arm, Patient Position: Sitting, Cuff Size: Normal)   Pulse 74   Ht 5\' 3"  (1.6 m)   Wt 181 lb (82.1 kg)   LMP 09/11/2017   BMI 32.06 kg/m   Skin warm and dry,  Breasts:no dominate palpable mass, retraction or nipple discharge Abdomen is soft and non tender Pelvic deferred  Upstream - 06/21/20 1042      Pregnancy Intention Screening   Does the patient want to become pregnant in the next year? No    Does the patient's partner want to become pregnant in the next year? No    Would the patient like to discuss contraceptive options today? No      Contraception Wrap Up   Current Method Female Sterilization    End Method Female Sterilization    Contraception Counseling Provided No              Assessment:     1. Breast discharge Check TSH and Prolactin level next week when has not manipulated the breasts She refuses to get mammogram   2. Pelvic pressure in female Will get GYN 06/23/20 to assess uterus and ovaries in about 4 weeks, she prefers in office    Plan:     Will talk when results back

## 2020-06-26 LAB — TSH: TSH: 1.44 u[IU]/mL (ref 0.450–4.500)

## 2020-06-26 LAB — PROLACTIN: Prolactin: 7.4 ng/mL (ref 4.8–23.3)

## 2020-07-19 ENCOUNTER — Ambulatory Visit (INDEPENDENT_AMBULATORY_CARE_PROVIDER_SITE_OTHER): Payer: BC Managed Care – PPO

## 2020-07-19 ENCOUNTER — Other Ambulatory Visit: Payer: Self-pay

## 2020-07-19 ENCOUNTER — Other Ambulatory Visit: Payer: BC Managed Care – PPO

## 2020-07-19 DIAGNOSIS — R102 Pelvic and perineal pain: Secondary | ICD-10-CM

## 2020-07-19 NOTE — Progress Notes (Signed)
PELVIC US TA/TV: Heterogeneous anteverted uterus,mult small simple nabothian cysts,EEC 5.2 mm,difficult to visualize endometrial boarders,normal ovaries,simple right adnexal cyst adjacent to right ovary 1.6 x 1.2 x 1.4 cm,no free fluid,ovaries appear mobile   [facility]

## 2020-07-23 ENCOUNTER — Telehealth: Payer: Self-pay | Admitting: Adult Health

## 2020-07-23 NOTE — Telephone Encounter (Signed)
Pt informed that US showed normal uterus and ovaries, EEC 5.2 mm and has small cyst adjacent to right ovary. Dr Despina Hidden has not read yet, when he does, it will show up in Mychart

## 2020-12-09 ENCOUNTER — Emergency Department (HOSPITAL_COMMUNITY): Payer: BC Managed Care – PPO

## 2020-12-09 ENCOUNTER — Encounter (HOSPITAL_COMMUNITY): Payer: Self-pay

## 2020-12-09 ENCOUNTER — Emergency Department (HOSPITAL_COMMUNITY)
Admission: EM | Admit: 2020-12-09 | Discharge: 2020-12-09 | Disposition: A | Discharge status: Home / Self Care | Payer: BC Managed Care – PPO | Attending: Emergency Medicine | Admitting: Emergency Medicine

## 2020-12-09 ENCOUNTER — Other Ambulatory Visit: Payer: Self-pay

## 2020-12-09 DIAGNOSIS — R7401 Elevation of levels of liver transaminase levels: Secondary | ICD-10-CM | POA: Insufficient documentation

## 2020-12-09 DIAGNOSIS — R1110 Vomiting, unspecified: Secondary | ICD-10-CM | POA: Insufficient documentation

## 2020-12-09 DIAGNOSIS — R55 Syncope and collapse: Secondary | ICD-10-CM | POA: Diagnosis not present

## 2020-12-09 DIAGNOSIS — R42 Dizziness and giddiness: Secondary | ICD-10-CM | POA: Insufficient documentation

## 2020-12-09 DIAGNOSIS — R519 Headache, unspecified: Secondary | ICD-10-CM | POA: Diagnosis not present

## 2020-12-09 DIAGNOSIS — Z87891 Personal history of nicotine dependence: Secondary | ICD-10-CM | POA: Insufficient documentation

## 2020-12-09 LAB — COMPREHENSIVE METABOLIC PANEL
ALT: 62 U/L — ABNORMAL HIGH (ref 0–44)
AST: 61 U/L — ABNORMAL HIGH (ref 15–41)
Albumin: 3.6 g/dL (ref 3.5–5.0)
Alkaline Phosphatase: 89 U/L (ref 38–126)
Anion gap: 5 (ref 5–15)
BUN: 9 mg/dL (ref 6–20)
CO2: 24 mmol/L (ref 22–32)
Calcium: 8.4 mg/dL — ABNORMAL LOW (ref 8.9–10.3)
Chloride: 106 mmol/L (ref 98–111)
Creatinine, Ser: 0.91 mg/dL (ref 0.44–1.00)
GFR, Estimated: >60 mL/min (ref >60)
Glucose, Bld: 107 mg/dL — ABNORMAL HIGH (ref 70–99)
Potassium: 3.6 mmol/L (ref 3.5–5.1)
Sodium: 135 mmol/L (ref 135–145)
Total Bilirubin: 0.6 mg/dL (ref 0.3–1.2)
Total Protein: 6.8 g/dL (ref 6.5–8.1)

## 2020-12-09 LAB — CBC WITH DIFFERENTIAL/PLATELET
Abs Immature Granulocytes: 0.04 10*3/uL (ref 0.00–0.07)
Basophils Absolute: 0 10*3/uL (ref 0.0–0.1)
Basophils Relative: 1 %
Eosinophils Absolute: 0.6 10*3/uL — ABNORMAL HIGH (ref 0.0–0.5)
Eosinophils Relative: 7 %
HCT: 39.7 % (ref 36.0–46.0)
Hemoglobin: 13.2 g/dL (ref 12.0–15.0)
Immature Granulocytes: 1 %
Lymphocytes Relative: 15 %
Lymphs Abs: 1.3 10*3/uL (ref 0.7–4.0)
MCH: 32.9 pg (ref 26.0–34.0)
MCHC: 33.2 g/dL (ref 30.0–36.0)
MCV: 99 fL (ref 80.0–100.0)
Monocytes Absolute: 0.9 10*3/uL (ref 0.1–1.0)
Monocytes Relative: 11 %
Neutro Abs: 5.7 10*3/uL (ref 1.7–7.7)
Neutrophils Relative %: 65 %
Platelets: 190 10*3/uL (ref 150–400)
RBC: 4.01 MIL/uL (ref 3.87–5.11)
RDW: 13.5 % (ref 11.5–15.5)
WBC: 8.6 10*3/uL (ref 4.0–10.5)
nRBC: 0 % (ref 0.0–0.2)

## 2020-12-09 LAB — URINALYSIS, ROUTINE W REFLEX MICROSCOPIC
Bilirubin Urine: NEGATIVE
Glucose, UA: NEGATIVE mg/dL
Hgb urine dipstick: NEGATIVE
Ketones, ur: NEGATIVE mg/dL
Leukocytes,Ua: NEGATIVE
Nitrite: NEGATIVE
Protein, ur: NEGATIVE mg/dL
Specific Gravity, Urine: 1.01 (ref 1.005–1.030)
pH: 7 (ref 5.0–8.0)

## 2020-12-09 LAB — PROTIME-INR
INR: 1 (ref 0.8–1.2)
Prothrombin Time: 12.8 seconds (ref 11.4–15.2)

## 2020-12-09 LAB — CBG MONITORING, ED: Glucose-Capillary: 89 mg/dL (ref 70–99)

## 2020-12-09 MED ORDER — IOHEXOL 350 MG/ML SOLN
75.0000 mL | Freq: Once | INTRAVENOUS | Status: AC | PRN
  Administered 2020-12-09: 75 mL via INTRAVENOUS

## 2020-12-09 MED ORDER — SODIUM CHLORIDE 0.9 % IV BOLUS
500.0000 mL | Freq: Once | INTRAVENOUS | Status: AC
  Administered 2020-12-09: 500 mL via INTRAVENOUS

## 2020-12-09 MED ORDER — PROMETHAZINE HCL 25 MG/ML IJ SOLN: 25.0000 mg | Freq: Once | INTRAMUSCULAR | Status: DC

## 2020-12-09 MED ORDER — PROMETHAZINE HCL 25 MG RE SUPP: 25.0000 mg | Freq: Four times a day (QID) | RECTAL | 0 refills | Status: DC | PRN

## 2020-12-09 MED ORDER — ONDANSETRON HCL 4 MG/2ML IJ SOLN
4.0000 mg | Freq: Once | INTRAMUSCULAR | Status: AC
  Administered 2020-12-09: 4 mg via INTRAVENOUS
  Filled 2020-12-09: qty 2

## 2020-12-09 NOTE — ED Provider Notes (Signed)
[facility] Provider Note   CSN: [id number] Arrival date & time: 12/09/20  1024     History Chief Complaint  Patient presents with   Dizziness    Mary Gilmore is a 50 y.o. female.  Patient complains of a headache dizziness and she passed out.  She has been vomiting profusely  The history is provided by the patient and medical records. No language interpreter was used.  Dizziness Quality:  Lightheadedness Severity:  Moderate Onset quality:  Sudden Timing:  Constant Progression:  Worsening Chronicity:  New Context: not when bending over   Relieved by:  Nothing Worsened by:  Nothing Ineffective treatments:  None tried Associated symptoms: headaches and vomiting   Associated symptoms: no blood in stool, no chest pain and no diarrhea       Past Medical History:  Diagnosis Date   Complication of anesthesia    H/O scleroderma    Headache    Hypotension    Menorrhagia 07/03/2013   Patient desires pregnancy 07/03/2013   PONV (postoperative nausea and vomiting)    Raynaud's disease    Unspecified symptom associated with female genital organs 07/01/2012    Patient Active Problem List   Diagnosis Date Noted   Breast discharge 06/21/2020   Pelvic pressure in female 06/21/2020   Scleroderma ([facility]) 03/19/2017   Chronic pelvic pain in female 04/30/2015   Menorrhagia 07/03/2013   Unspecified symptom associated with female genital organs 07/01/2012    Past Surgical History:  Procedure Laterality Date   DILITATION & CURRETTAGE/HYSTROSCOPY WITH NOVASURE ABLATION N/A 05/21/2015   Procedure: DILATATION, HYSTEROSCOPY WITH NOVASURE ENDOMETRIAL ABLATION; Uterine cavity length 5 cm; uterine cavity width = 4.3 cm; power =  118    watts; time = 1 minutes 48 seconds;  Surgeon: Jonnie Kind, MD;  Location: [facility];  Service: Gynecology;  Laterality: N/A;   ELBOW ARTHROPLASTY Right    reconstruction   MANDIBLE FRACTURE SURGERY     RECTOCELE REPAIR     TUBAL  LIGATION     tubal reversal  06/06/2012     OB History     Gravida  3   Para  2   Term      Preterm      AB  1   Living  2      SAB  1   IAB      Ectopic      Multiple      Live Births  2           Family History  Problem Relation Age of Onset   Heart disease Father        atrial fib   Cancer Brother        hodgkins   ALS Mother    Diabetes Other    Hypertension Other     Social History   Tobacco Use   Smoking status: Former    Packs/day: 0.25    Years: 16.00    Pack years: 4.00    Types: Cigarettes    Quit date: 05/19/2005    Years since quitting: 15.5   Smokeless tobacco: Never  Substance Use Topics   Alcohol use: No   Drug use: No    Types: Marijuana    Home Medications Prior to Admission medications   Medication Sig Start Date End Date Taking? Authorizing Provider  esomeprazole (NEXIUM) 20 MG capsule Take 20 mg by mouth every morning. 12/04/20  Yes [provider]  Omega-3 Fatty  Acids (FISH OIL) 1000 MG CAPS Take 1,000 mg by mouth daily.   Yes [provider]  promethazine (PHENERGAN) 25 MG suppository Place 1 suppository (25 mg total) rectally every 6 (six) hours as needed for nausea or vomiting. 12/09/20  Yes Bethann Berkshire, MD  Turmeric 500 MG TABS Take 500 mg by mouth daily.   Yes [provider]  VITAMIN D PO Take 1,000 Units by mouth daily.   Yes [provider]  Vitamin D, Ergocalciferol, (DRISDOL) 50000 units CAPS capsule Take 50,000 Units by mouth every 30 (thirty) days.   Yes [provider]  vitamin E 1000 UNIT capsule Take 1,000 Units by mouth daily.   Yes [provider]  phentermine 37.5 MG capsule Take 1 capsule (37.5 mg total) by mouth every morning. 2 hours before breakfast with 8 ounces water Patient not taking: No sig reported 07/07/19   [name], MD    Allergies    Codeine, Other, Adhesive [tape], and Morphine and related  Review of Systems   Review of  Systems  Constitutional:  Negative for appetite change and fatigue.  HENT:  Negative for congestion, ear discharge and sinus pressure.   Eyes:  Negative for discharge.  Respiratory:  Negative for cough.   Cardiovascular:  Negative for chest pain.  Gastrointestinal:  Positive for vomiting. Negative for abdominal pain, blood in stool and diarrhea.  Genitourinary:  Negative for frequency and hematuria.  Musculoskeletal:  Negative for back pain.  Skin:  Negative for rash.  Neurological:  Positive for dizziness and headaches. Negative for seizures.  Psychiatric/Behavioral:  Negative for hallucinations.    Physical Exam Updated Vital Signs BP 105/64 (BP Location: Right Arm)   Pulse (!) 58   Temp 98.3 F (36.8 C) (Oral)   Resp 15   Ht 5\' 2"  (1.575 m)   Wt 77.1 kg   LMP 09/11/2017   SpO2 100%   BMI 31.09 kg/m   Physical Exam Vitals and nursing note reviewed.  Constitutional:      General: She is in acute distress.     Appearance: She is well-developed.  HENT:     Head: Normocephalic.     Nose: Nose normal.  Eyes:     General: No scleral icterus.    Conjunctiva/sclera: Conjunctivae normal.  Neck:     Thyroid: No thyromegaly.  Cardiovascular:     Rate and Rhythm: Normal rate and regular rhythm.     Heart sounds: No murmur heard.   No friction rub. No gallop.  Pulmonary:     Breath sounds: No stridor. No wheezing or rales.  Chest:     Chest wall: No tenderness.  Abdominal:     General: There is no distension.     Tenderness: There is no abdominal tenderness. There is no rebound.  Musculoskeletal:        General: Normal range of motion.     Cervical back: Neck supple.  Lymphadenopathy:     Cervical: No cervical adenopathy.  Skin:    Findings: No erythema or rash.  Neurological:     Mental Status: She is oriented to person, place, and time.     Motor: No abnormal muscle tone.     Coordination: Coordination normal.  Psychiatric:        Behavior: Behavior normal.     ED Results / Procedures / Treatments   Labs (all labs ordered are listed, but only abnormal results are displayed) Labs Reviewed  CBC WITH DIFFERENTIAL/PLATELET - Abnormal;  Notable for the following components:      Result Value   Eosinophils Absolute 0.6 (*)    All other components within normal limits  COMPREHENSIVE METABOLIC PANEL - Abnormal; Notable for the following components:   Glucose, Bld 107 (*)    Calcium 8.4 (*)    AST 61 (*)    ALT 62 (*)    All other components within normal limits  PROTIME-INR  URINALYSIS, ROUTINE W REFLEX MICROSCOPIC  CBG MONITORING, ED    EKG None  Radiology CT Angio Head W or Wo Contrast  Result Date: 12/09/2020 CLINICAL DATA:  Dizziness, non-specific; Dizziness, persistent/recurrent, cardiac or vascular cause suspected EXAM: CT ANGIOGRAPHY HEAD AND NECK TECHNIQUE: Multidetector CT imaging of the head and neck was performed using the standard protocol during bolus administration of intravenous contrast. Multiplanar CT image reconstructions and MIPs were obtained to evaluate the vascular anatomy. Carotid stenosis measurements (when applicable) are obtained utilizing NASCET criteria, using the distal internal carotid diameter as the denominator. CONTRAST:  22mL OMNIPAQUE IOHEXOL 350 MG/ML SOLN COMPARISON:  None FINDINGS: CT HEAD Brain: There is no acute intracranial hemorrhage, mass effect, or edema. Gray-white differentiation is preserved. There is no extra-axial fluid collection. Ventricles and sulci are within normal limits in size and configuration. Vascular: No hyperdense vessel. Skull: Calvarium is unremarkable. Sinuses/Orbits: No acute finding. Other: None. Review of the MIP images confirms the above findings CTA NECK Aortic arch: Great vessel origins are patent. Right carotid system: Patent.  No stenosis. Left carotid system: Patent.  No stenosis. Vertebral arteries: Patent.  No stenosis. Skeleton: Mild cervical spine degenerative changes. Other  neck: Unremarkable. Upper chest: No apical lung mass. Review of the MIP images confirms the above findings CTA HEAD Anterior circulation: Intracranial internal carotid arteries are patent. Anterior cerebral arteries are patent. Right A1 ACA is dominant. Posterior circulation:  Middle cerebral arteries are patent. Venous sinuses: Intracranial vertebral arteries are patent. Basilar artery is patent. Major cerebellar artery origins are patent. Bilateral posterior communicating arteries are present. Posterior cerebral arteries are patent. Review of the MIP images confirms the above findings IMPRESSION: No acute intracranial abnormality. No large vessel occlusion, hemodynamically significant stenosis, or evidence of dissection. Electronically Signed   By: Guadlupe Spanish M.D.   On: 12/09/2020 12:07   CT Angio Neck W and/or Wo Contrast  Result Date: 12/09/2020 CLINICAL DATA:  Dizziness, non-specific; Dizziness, persistent/recurrent, cardiac or vascular cause suspected EXAM: CT ANGIOGRAPHY HEAD AND NECK TECHNIQUE: Multidetector CT imaging of the head and neck was performed using the standard protocol during bolus administration of intravenous contrast. Multiplanar CT image reconstructions and MIPs were obtained to evaluate the vascular anatomy. Carotid stenosis measurements (when applicable) are obtained utilizing NASCET criteria, using the distal internal carotid diameter as the denominator. CONTRAST:  22mL OMNIPAQUE IOHEXOL 350 MG/ML SOLN COMPARISON:  None FINDINGS: CT HEAD Brain: There is no acute intracranial hemorrhage, mass effect, or edema. Gray-white differentiation is preserved. There is no extra-axial fluid collection. Ventricles and sulci are within normal limits in size and configuration. Vascular: No hyperdense vessel. Skull: Calvarium is unremarkable. Sinuses/Orbits: No acute finding. Other: None. Review of the MIP images confirms the above findings CTA NECK Aortic arch: Great vessel origins are patent.  Right carotid system: Patent.  No stenosis. Left carotid system: Patent.  No stenosis. Vertebral arteries: Patent.  No stenosis. Skeleton: Mild cervical spine degenerative changes. Other neck: Unremarkable. Upper chest: No apical lung mass. Review of the MIP images confirms the above findings CTA HEAD Anterior  circulation: Intracranial internal carotid arteries are patent. Anterior cerebral arteries are patent. Right A1 ACA is dominant. Posterior circulation:  Middle cerebral arteries are patent. Venous sinuses: Intracranial vertebral arteries are patent. Basilar artery is patent. Major cerebellar artery origins are patent. Bilateral posterior communicating arteries are present. Posterior cerebral arteries are patent. Review of the MIP images confirms the above findings IMPRESSION: No acute intracranial abnormality. No large vessel occlusion, hemodynamically significant stenosis, or evidence of dissection. Electronically Signed   By: Macy Mis M.D.   On: 12/09/2020 12:07   MR BRAIN WO CONTRAST  Result Date: 12/09/2020 CLINICAL DATA:  Dizziness, non-specific EXAM: MRI HEAD WITHOUT CONTRAST TECHNIQUE: Multiplanar, multiecho pulse sequences of the brain and surrounding structures were obtained without intravenous contrast. COMPARISON:  None. FINDINGS: Brain: There is no acute infarction or intracranial hemorrhage. There is no intracranial mass, mass effect, or edema. There is no hydrocephalus or extra-axial fluid collection. Ventricles and sulci are normal in size and configuration. Vascular: Major vessel flow voids at the skull base are preserved. Skull and upper cervical spine: Normal marrow signal is preserved. Sinuses/Orbits: Paranasal sinuses are aerated. Orbits are unremarkable. Other: Sella is unremarkable.  Mastoid air cells are clear. IMPRESSION: No acute infarction, hemorrhage, or mass. Electronically Signed   By: Macy Mis M.D.   On: 12/09/2020 14:49    Procedures Procedures   Medications  Ordered in ED Medications  promethazine (PHENERGAN) injection 25 mg (0 mg Intramuscular Hold 12/09/20 1119)  ondansetron (ZOFRAN) injection 4 mg (4 mg Intravenous Given 12/09/20 1149)  sodium chloride 0.9 % bolus 500 mL (500 mLs Intravenous New Bag/Given 12/09/20 1118)  iohexol (OMNIPAQUE) 350 MG/ML injection 75 mL (75 mLs Intravenous Contrast Given 12/09/20 1136)  /edcr   ED Course  I have reviewed the triage vital signs and the nursing notes.  Pertinent labs & imaging results that were available during my care of the patient were reviewed by me and considered in my medical decision making (see chart for details). CRITICAL CARE Performed by: Milton Ferguson Total critical care time: 40 minutes Critical care time was exclusive of separately billable procedures and treating other patients. Critical care was necessary to treat or prevent imminent or life-threatening deterioration. Critical care was time spent personally by me on the following activities: development of treatment plan with patient and/or surrogate as well as nursing, discussions with consultants, evaluation of patient's response to treatment, examination of patient, obtaining history from patient or surrogate, ordering and performing treatments and interventions, ordering and review of laboratory studies, ordering and review of radiographic studies, pulse oximetry and re-evaluation of patient's condition.    MDM Rules/Calculators/A&P                           Dizziness syncope and persistent vomiting.  Labs unremarkable except for minimal elevated liver enzymes.  CT scan and MRI negative she will follow-up with her GI and neurology and is given Phenergan suppositories Final Clinical Impression(s) / ED Diagnoses Final diagnoses:  Dizziness    Rx / DC Orders ED Discharge Orders          Ordered    promethazine (PHENERGAN) 25 MG suppository  Every 6 hours PRN        12/09/20 1513             Milton Ferguson,  MD 12/10/20 1725

## 2020-12-09 NOTE — ED Notes (Signed)
Pt refused COVID test and stated that she did not have COVID.

## 2020-12-09 NOTE — ED Notes (Signed)
Daughter reports symptoms started at approx 10am.

## 2020-12-09 NOTE — Discharge Instructions (Signed)
Follow-up with either your gastroenterologist or the one I have referred you to and follow-up with Dr. Gerilyn Pilgrim for your dizziness and headache.

## 2020-12-09 NOTE — ED Triage Notes (Signed)
Pt reports was at work went to the restroom and didn't feel well reports started feeling dizzy and passed out.  Daughter says pt confused at times.  Pt presently vomiting.  EDP assessed pt in hallway.  Daughter says pt is not acting like herself.  Pt moaning will not answer questions.

## 2021-04-01 ENCOUNTER — Ambulatory Visit (INDEPENDENT_AMBULATORY_CARE_PROVIDER_SITE_OTHER): Payer: BC Managed Care – PPO | Admitting: Adult Health

## 2021-04-01 ENCOUNTER — Encounter: Payer: Self-pay | Admitting: Adult Health

## 2021-04-01 ENCOUNTER — Other Ambulatory Visit: Payer: Self-pay

## 2021-04-01 VITALS — BP 112/74 | HR 58 | Ht 63.0 in | Wt 189.0 lb

## 2021-04-01 DIAGNOSIS — R635 Abnormal weight gain: Secondary | ICD-10-CM

## 2021-04-01 DIAGNOSIS — R232 Flushing: Secondary | ICD-10-CM

## 2021-04-01 DIAGNOSIS — G479 Sleep disorder, unspecified: Secondary | ICD-10-CM

## 2021-04-01 DIAGNOSIS — R61 Generalized hyperhidrosis: Secondary | ICD-10-CM

## 2021-04-01 DIAGNOSIS — Z01419 Encounter for gynecological examination (general) (routine) without abnormal findings: Secondary | ICD-10-CM | POA: Insufficient documentation

## 2021-04-01 DIAGNOSIS — R6882 Decreased libido: Secondary | ICD-10-CM

## 2021-04-01 DIAGNOSIS — Z1211 Encounter for screening for malignant neoplasm of colon: Secondary | ICD-10-CM | POA: Insufficient documentation

## 2021-04-01 LAB — HEMOCCULT GUIAC POC 1CARD (OFFICE): Fecal Occult Blood, POC: NEGATIVE

## 2021-04-01 NOTE — Progress Notes (Signed)
Patient ID: NAI MAGAZINE, female   DOB: Nov 15, 1970, 51 y.o.   MRN: [id number] History of Present Illness: Mary Gilmore is a 51 year old white female, married, PM , in complaining of hot flashes, night sweats, weight gain, sleep disturbance, wakes up between 1-3 am, no libidio, foggy brain, more headaches, skin itches, vision has gotten worse. Last period about 1.5 years ago. PCP is Dr Belenda Cruise in [place].  Lab Results  Component Value Date   DIAGPAP  06/09/2019    - Negative for intraepithelial lesion or malignancy (NILM)   HPV NOT DETECTED 03/19/2017   [name] Negative 06/09/2019    Current Medications, Allergies, Past Medical History, Past Surgical History, Family History and Social History were reviewed in [employer] record.     Review of Systems: Patient denies any hearing loss, fatigue,  shortness of breath, chest pain, abdominal pain, problems with bowel movements, urination, or intercourse. No joint pain or mood swings.  See HPI for positives.   Physical Exam:BP 112/74 (BP Location: Right Arm, Patient Position: Sitting, Cuff Size: Normal)    Pulse (!) 58    Ht 5\' 3"  (1.6 m)    Wt 189 lb (85.7 kg)    LMP 09/11/2017    BMI 33.48 kg/m   General:  Well developed, well nourished, no acute distress Skin:  Warm and dry Neck:  Midline trachea, normal thyroid, good ROM, no lymphadenopathy Lungs; Clear to auscultation bilaterally Breast:  No dominant palpable mass, retraction, or nipple discharge Cardiovascular: Regular rate and rhythm Abdomen:  Soft, non tender, no hepatosplenomegaly Pelvic:  External genitalia is normal in appearance, no lesions.  The vagina is normal in appearance. Urethra has no lesions or masses. The cervix is bulbous.  Uterus is felt to be normal size, shape, and contour.  No adnexal masses or tenderness noted.Bladder is non tender, no masses felt. Rectal: Good sphincter tone, no polyps, or hemorrhoids felt.  Hemoccult  negative. Extremities/musculoskeletal:  No swelling or varicosities noted, no clubbing or cyanosis Psych:  No mood changes, alert and cooperative,seems happy Refused AA Depression screen [facility] 2/9 04/01/2021  Decreased Interest 0  Down, Depressed, Hopeless 0  PHQ - 2 Score 0  Altered sleeping 0  Tired, decreased energy 0  Change in appetite 0  Feeling bad or failure about yourself  0  Trouble concentrating 0  Moving slowly or fidgety/restless 0  Suicidal thoughts 0  PHQ-9 Score 0    GAD 7 : Generalized Anxiety Score 04/01/2021  Nervous, Anxious, on Edge 0  Control/stop worrying 0  Worry too much - different things 0  Trouble relaxing 0  Restless 0  Easily annoyed or irritable 0  Afraid - awful might happen 0  Total GAD 7 Score 0      Upstream - 04/01/21 1545       Pregnancy Intention Screening   Does the patient want to become pregnant in the next year? No    Does the patient's partner want to become pregnant in the next year? No    Would the patient like to discuss contraceptive options today? No      Contraception Wrap Up   Current Method Female Sterilization   PM   End Method Female Sterilization   PM   Contraception Counseling Provided No             Examination chaperoned by Celene Squibb LPN  Impression and Plan: 1. Encounter for well woman exam with routine gynecological exam Pap and physical in  1 year Labs with PCP Pt refused mammogram   2. Encounter for screening fecal occult blood testing - POCT occult blood stool  3. Hot flashes Will check labs Discussed menopause symptoms  and HRT as possible option, will talk when labs back.  - Follicle stimulating hormone - Estradiol  4. Night sweats - Follicle stimulating hormone - Estradiol  5. Decreased libido - Follicle stimulating hormone - Estradiol  6. Weight gain - TSH  7. Sleep disturbance

## 2021-04-02 ENCOUNTER — Telehealth: Payer: Self-pay | Admitting: Adult Health

## 2021-04-02 LAB — TSH: TSH: 1.71 u[IU]/mL (ref 0.450–4.500)

## 2021-04-02 LAB — FOLLICLE STIMULATING HORMONE: FSH: 44.8 m[IU]/mL

## 2021-04-02 LAB — ESTRADIOL: Estradiol: 49.3 pg/mL

## 2021-04-02 NOTE — Telephone Encounter (Signed)
Patient wanted to know if her cmp was tested also. Please advise.  ?

## 2021-05-04 IMAGING — DX DG CHEST 2V
2 series · 2 of 2 positions shown · non-contrast
Comparison: None.

CLINICAL DATA: Cough.

EXAM:
CHEST - 2 VIEW

[chest pa]
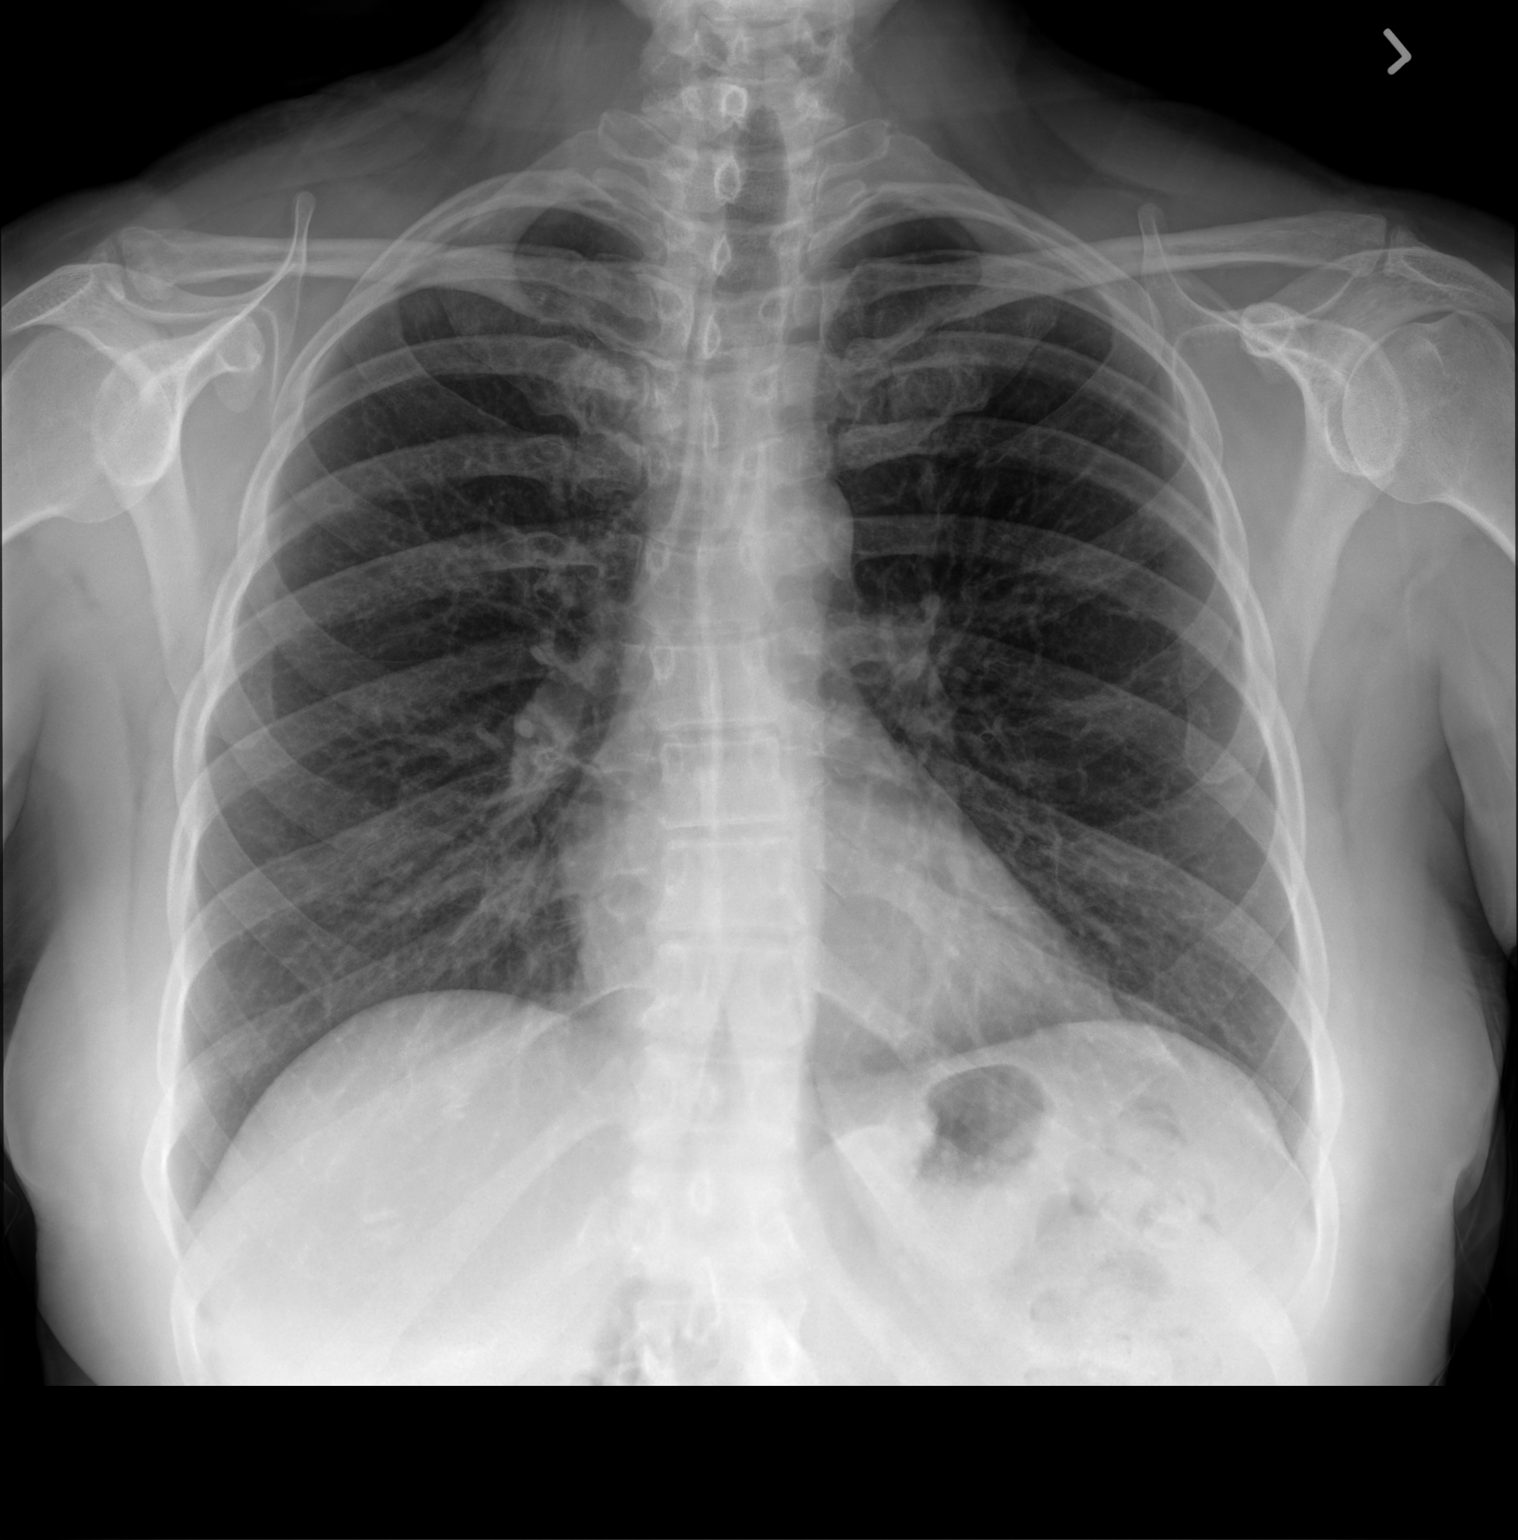

[chest lat]
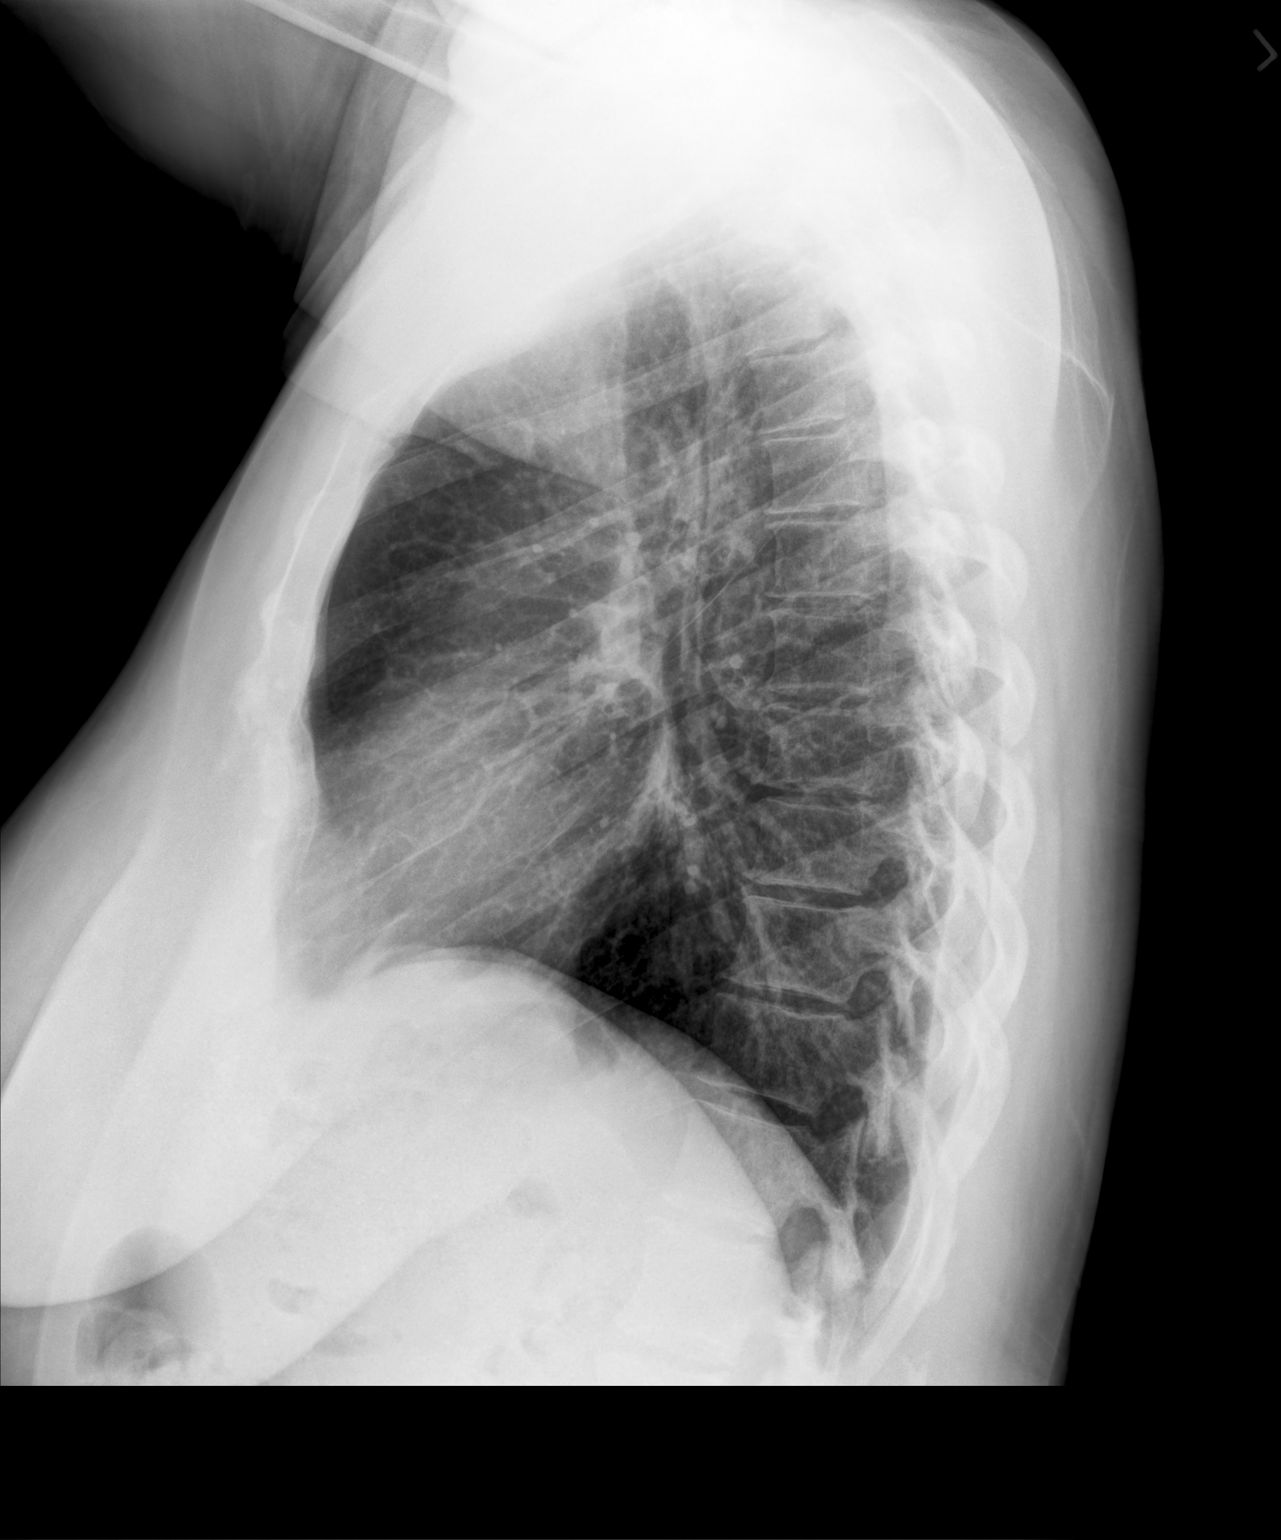

[2 of 2 positions shown; findings below may reference images not displayed]

FINDINGS: Lungs are adequately inflated and otherwise clear. Cardiomediastinal
silhouette is normal. Bones and soft tissues are normal.
IMPRESSION: No active cardiopulmonary disease.

## 2022-04-17 ENCOUNTER — Encounter: Payer: Self-pay | Admitting: Adult Health

## 2022-04-17 ENCOUNTER — Other Ambulatory Visit (HOSPITAL_COMMUNITY)
Admission: RE | Admit: 2022-04-17 | Discharge: 2022-04-17 | Disposition: A | Discharge status: Home / Self Care | Payer: BC Managed Care – PPO | Source: Ambulatory Visit | Attending: Adult Health | Admitting: Adult Health

## 2022-04-17 ENCOUNTER — Ambulatory Visit (INDEPENDENT_AMBULATORY_CARE_PROVIDER_SITE_OTHER): Payer: BC Managed Care – PPO | Admitting: Adult Health

## 2022-04-17 VITALS — BP 125/66 | HR 60 | Ht 63.0 in | Wt 191.0 lb

## 2022-04-17 DIAGNOSIS — R232 Flushing: Secondary | ICD-10-CM | POA: Diagnosis not present

## 2022-04-17 DIAGNOSIS — R635 Abnormal weight gain: Secondary | ICD-10-CM | POA: Diagnosis not present

## 2022-04-17 DIAGNOSIS — Z780 Asymptomatic menopausal state: Secondary | ICD-10-CM

## 2022-04-17 DIAGNOSIS — Z1211 Encounter for screening for malignant neoplasm of colon: Secondary | ICD-10-CM

## 2022-04-17 DIAGNOSIS — Z01419 Encounter for gynecological examination (general) (routine) without abnormal findings: Secondary | ICD-10-CM | POA: Insufficient documentation

## 2022-04-17 LAB — HEMOCCULT GUIAC POC 1CARD (OFFICE): Fecal Occult Blood, POC: NEGATIVE

## 2022-04-17 NOTE — Progress Notes (Signed)
Patient ID: Mary Gilmore, female   DOB: [birth year], 52 y.o.   MRN: [id number] History of Present Illness: Mary Gilmore is a 52 year old white female, married, PM in for a well woman gyn exam and pap. Denies any vaginal bleeding.has some hot flashes and weight gain. She is working at [employer] now.   PCP is Dr Mary Gilmore  Current Medications, Allergies, Past Medical History, Past Surgical History, Family History and Social History were reviewed in [facility] record.     Review of Systems: Patient denies any headaches, hearing loss, fatigue, blurred vision, shortness of breath, chest pain, abdominal pain, problems with bowel movements, urination, or intercourse. No joint pain or mood swings.  See HPI for positives  Physical Exam:BP 125/66 (BP Location: Left Arm, Patient Position: Sitting, Cuff Size: Normal)   Pulse 60   Ht 5\' 3"  (1.6 m)   Wt 191 lb (86.6 kg)   LMP 09/11/2017   BMI 33.83 kg/m   General:  Well developed, well nourished, no acute distress Skin:  Warm and dry Neck:  Midline trachea, normal thyroid, good ROM, no lymphadenopathy Lungs; Clear to auscultation bilaterally Breast:  No dominant palpable mass, retraction, or nipple discharge Cardiovascular: Regular rate and rhythm Abdomen:  Soft, non tender, no hepatosplenomegaly Pelvic:  External genitalia is normal in appearance, no lesions.  The vagina is normal in appearance. Urethra has no lesions or masses. The cervix is bulbous.Pap with HR  HPV genotyping performed.  Uterus is felt to be normal size, shape, and contour.  No adnexal masses or tenderness noted.Bladder is non tender, no masses felt. Rectal: Good sphincter tone, no polyps, or hemorrhoids felt.  Hemoccult negative. Extremities/musculoskeletal:  No swelling or varicosities noted, no clubbing or cyanosis Psych:  No mood changes, alert and cooperative,seems happy AA is 0 Fall risk is low Pt refused PHQ 9 and GAD 7  Upstream - 04/17/22 0924        Pregnancy Intention Screening   Does the patient want to become pregnant in the next year? No    Does the patient's partner want to become pregnant in the next year? No    Would the patient like to discuss contraceptive options today? No      Contraception Wrap Up   Current Method --   PM   End Method --   PM   Contraception Counseling Provided No             Examination chaperoned by Levy Pupa LPN   Impression and Plan: 1. Encounter for gynecological examination with Papanicolaou smear of cervix Pap sent Pap in 3 years if normal Physical in 1 year Labs with rheumatologist She declines getting mammogram  Colonoscopy per GI   - Cytology - PAP( [place])  2. Encounter for screening fecal occult blood testing Hemoccult was negative  - POCT occult blood stool  3. Hot flashes  4. Weight gain She is trying to eat better and exercise   5. Postmenopause Denies any vaginal bleeding

## 2022-04-20 IMAGING — CT CT ANGIO HEAD
2 of 12 series · 6 of 35 positions shown · IV contrast (omnipaque)
Comparison: None

CLINICAL DATA: Dizziness, non-specific; Dizziness,
persistent/recurrent, cardiac or vascular cause suspected

EXAM:
CT ANGIOGRAPHY HEAD AND NECK
TECHNIQUE: Multidetector CT imaging of the head and neck was performed using
the standard protocol during bolus administration of intravenous
contrast. Multiplanar CT image reconstructions and MIPs were
obtained to evaluate the vascular anatomy. Carotid stenosis
measurements (when applicable) are obtained utilizing NASCET
criteria, using the distal internal carotid diameter as the
denominator.
CONTRAST:  75mL OMNIPAQUE IOHEXOL 350 MG/ML SOLN

[Series 9: cta head & neck · axial · 0.45mm/px · z∈[+1121,+1336]mm · 4 of 734 slices shown]
[im 147/734  soft-tissue]
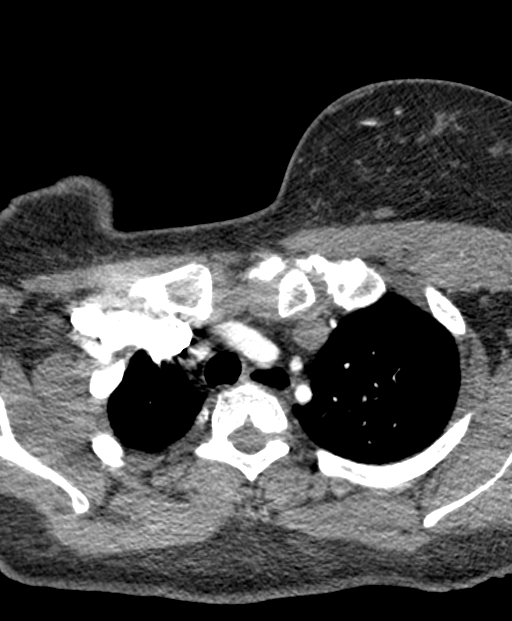
[im 294/734  bone]
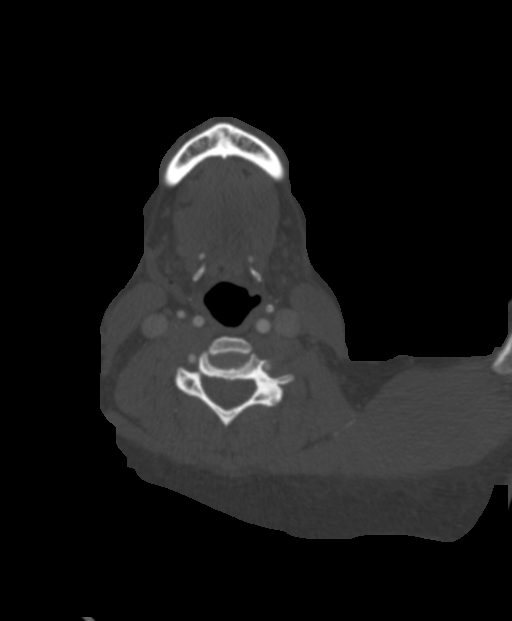
[im 440/734  soft-tissue]
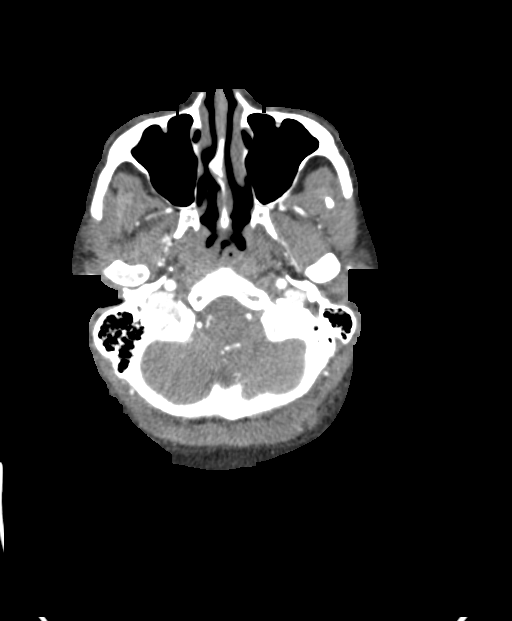
[im 587/734  bone]
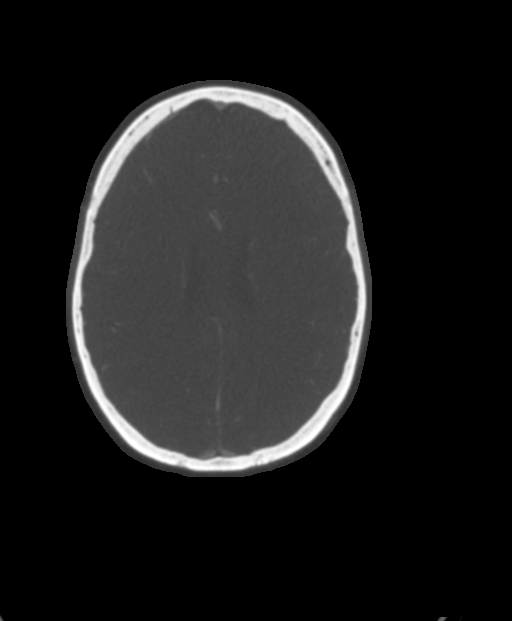

[Series 10: ax thins · axial · 0.45mm/px · z∈[+1169,+1288]mm · 2 of 367 slices shown]
[im 123/367  soft-tissue]
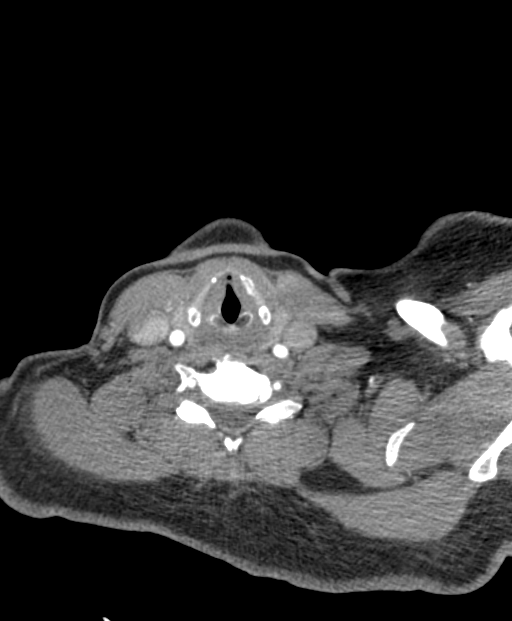
[im 245/367  soft-tissue]
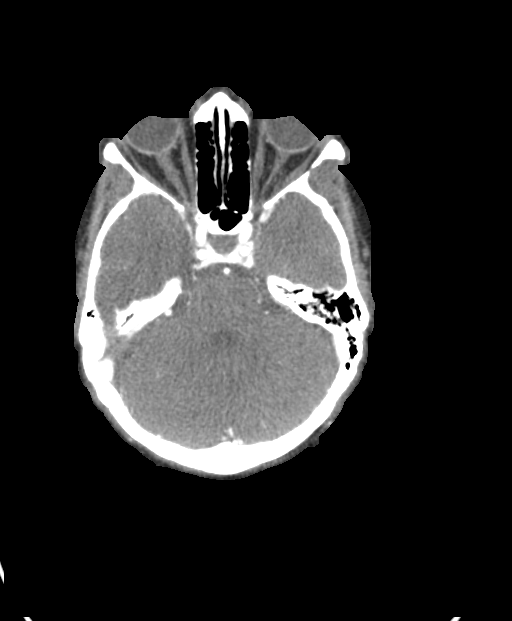

[6 of 35 positions shown; findings below may reference images not displayed]

FINDINGS: CT HEAD

Brain: There is no acute intracranial hemorrhage, mass effect, or
edema. Gray-white differentiation is preserved. There is no
extra-axial fluid collection. Ventricles and sulci are within normal
limits in size and configuration.

Vascular: No hyperdense vessel.

Skull: Calvarium is unremarkable.

Sinuses/Orbits: No acute finding.

Other: None.

Review of the MIP images confirms the above findings

CTA NECK

Aortic arch: Great vessel origins are patent.

Right carotid system: Patent.  No stenosis.

Left carotid system: Patent.  No stenosis.

Vertebral arteries: Patent.  No stenosis.

Skeleton: Mild cervical spine degenerative changes.

Other neck: Unremarkable.

Upper chest: No apical lung mass.

Review of the MIP images confirms the above findings

CTA HEAD

Anterior circulation: Intracranial internal carotid arteries are
patent. Anterior cerebral arteries are patent. Right A1 ACA is
dominant.

Posterior circulation:  Middle cerebral arteries are patent.

Venous sinuses: Intracranial vertebral arteries are patent. Basilar
artery is patent. Major cerebellar artery origins are patent.
Bilateral posterior communicating arteries are present. Posterior
cerebral arteries are patent.

Review of the MIP images confirms the above findings
IMPRESSION: No acute intracranial abnormality.

No large vessel occlusion, hemodynamically significant stenosis, or
evidence of dissection.

## 2022-04-22 LAB — CYTOLOGY - PAP
Adequacy: ABSENT
Comment: NEGATIVE
Comment: NEGATIVE
Comment: NEGATIVE
Diagnosis: NEGATIVE
HPV 16: NEGATIVE
HPV 18 / 45: NEGATIVE
High risk HPV: POSITIVE — AB

## 2022-04-24 ENCOUNTER — Encounter: Payer: Self-pay | Admitting: Adult Health

## 2022-04-24 DIAGNOSIS — R87619 Unspecified abnormal cytological findings in specimens from cervix uteri: Secondary | ICD-10-CM | POA: Insufficient documentation

## 2022-04-24 DIAGNOSIS — R87810 Cervical high risk human papillomavirus (HPV) DNA test positive: Secondary | ICD-10-CM | POA: Insufficient documentation

## 2022-10-19 ENCOUNTER — Ambulatory Visit (INDEPENDENT_AMBULATORY_CARE_PROVIDER_SITE_OTHER): Payer: 59 | Admitting: Adult Health

## 2022-10-19 ENCOUNTER — Encounter: Payer: Self-pay | Admitting: Adult Health

## 2022-10-19 VITALS — BP 108/68 | HR 75 | Ht 63.0 in | Wt 188.5 lb

## 2022-10-19 DIAGNOSIS — R635 Abnormal weight gain: Secondary | ICD-10-CM

## 2022-10-19 DIAGNOSIS — R102 Pelvic and perineal pain: Secondary | ICD-10-CM

## 2022-10-19 DIAGNOSIS — R140 Abdominal distension (gaseous): Secondary | ICD-10-CM | POA: Diagnosis not present

## 2022-10-19 DIAGNOSIS — R6882 Decreased libido: Secondary | ICD-10-CM

## 2022-10-19 DIAGNOSIS — R232 Flushing: Secondary | ICD-10-CM | POA: Diagnosis not present

## 2022-10-19 DIAGNOSIS — R4589 Other symptoms and signs involving emotional state: Secondary | ICD-10-CM

## 2022-10-19 DIAGNOSIS — Z780 Asymptomatic menopausal state: Secondary | ICD-10-CM

## 2022-10-19 NOTE — Progress Notes (Signed)
Subjective:     Patient ID: KERRIA FRIEDERICHS, female   DOB: 1970/12/09, 52 y.o.   MRN: [id number]  HPI Ivis is a 52 year old white female, married, PM in complaining of pelvic pressure and bloating about about 4 weeks, and weight is up and down. No sex drive, and moody. She had labs with PCP.     Component Value Date/Time   DIAGPAP  04/17/2022 0925    - Negative for intraepithelial lesion or malignancy (NILM)   DIAGPAP  06/09/2019 0851    - Negative for intraepithelial lesion or malignancy (NILM)   DIAGPAP  03/19/2017 0000    NEGATIVE FOR INTRAEPITHELIAL LESIONS OR MALIGNANCY.   HPVHIGH Positive (A) 04/17/2022 0925   HPVHIGH Negative 06/09/2019 0851   ADEQPAP  04/17/2022 0925    Satisfactory for evaluation; transformation zone component ABSENT.   ADEQPAP  06/09/2019 0851    Satisfactory for evaluation; transformation zone component PRESENT.   ADEQPAP  03/19/2017 0000    Satisfactory for evaluation  endocervical/transformation zone component PRESENT.    PCP is Dr Sharma Covert   Review of Systems +pelvic pressure +bloating Weight up and down  +no sex drive  +moody   Reviewed past medical,surgical, social and family history. Reviewed medications and allergies.   Objective:   Physical Exam BP 108/68 (BP Location: Left Arm, Patient Position: Sitting, Cuff Size: Normal)   Pulse 75   Ht 5\' 3"  (1.6 m)   Wt 188 lb 8 oz (85.5 kg)   LMP 09/11/2017   BMI 33.39 kg/m     Skin warm and dry.Pelvic: external genitalia is normal in appearance no lesions, vagina: pink,urethra has no lesions or masses noted, cervix:smooth and bulbous, uterus: normal size, shape and contour,mildly tender, no masses felt, adnexa: no masses, has + tenderness noted, L>R.. Bladder is non tender and no masses felt. Fall risk is low  Upstream - 10/19/22 1552       Pregnancy Intention Screening   Does the patient want to become pregnant in the next year? N/A    Does the patient's partner want to become pregnant in  the next year? N/A    Would the patient like to discuss contraceptive options today? N/A      Contraception Wrap Up   Current Method No Method - Other Reason   PM   Reason for No Current Contraceptive Method at Intake ([facility] Only) Other    End Method No Method - Other Reason   PM   Contraception Counseling Provided No            Examination chaperoned by Malachy Mood LPN [place] [employer] NP student in for exam.  Assessment:      1. Pelvic pressure in female +pelvic pressure  Will get [place] to assess uterus and ovaries in office 10/30/22 and see me after  - US PELVIC COMPLETE WITH TRANSVAGINAL; Future  2. Bloating +bloating  Will get pelvic US  - US PELVIC COMPLETE WITH TRANSVAGINAL; Future  3. Decreased libido   4. Hot flashes  5. Weight gain  6. Moody  7. Postmenopause     Plan:     Return in 11 days for GYN [place] and see me after

## 2022-10-22 ENCOUNTER — Encounter: Payer: Self-pay | Admitting: Adult Health

## 2022-10-30 ENCOUNTER — Ambulatory Visit (INDEPENDENT_AMBULATORY_CARE_PROVIDER_SITE_OTHER): Payer: 59 | Admitting: Adult Health

## 2022-10-30 ENCOUNTER — Ambulatory Visit (INDEPENDENT_AMBULATORY_CARE_PROVIDER_SITE_OTHER): Payer: BC Managed Care – PPO | Admitting: Radiology

## 2022-10-30 ENCOUNTER — Encounter: Payer: Self-pay | Admitting: Adult Health

## 2022-10-30 VITALS — BP 108/65 | HR 57 | Ht 63.0 in | Wt 187.0 lb

## 2022-10-30 DIAGNOSIS — R102 Pelvic and perineal pain: Secondary | ICD-10-CM

## 2022-10-30 DIAGNOSIS — R140 Abdominal distension (gaseous): Secondary | ICD-10-CM

## 2022-10-30 DIAGNOSIS — R4589 Other symptoms and signs involving emotional state: Secondary | ICD-10-CM | POA: Diagnosis not present

## 2022-10-30 DIAGNOSIS — R232 Flushing: Secondary | ICD-10-CM

## 2022-10-30 DIAGNOSIS — R6882 Decreased libido: Secondary | ICD-10-CM | POA: Diagnosis not present

## 2022-10-30 MED ORDER — PRENATAL PLUS 27-1 MG PO TABS: 1.0000 | ORAL_TABLET | Freq: Every day | ORAL | 12 refills | Status: DC

## 2022-10-30 NOTE — Progress Notes (Signed)
  Subjective:     Patient ID: Mary Gilmore, female   DOB: 1970/12/21, 52 y.o.   MRN: [id number]  HPI Mary Gilmore is a 52 year old white female, married, PM in for [place] to assess pelvic pressure and bloating. She said TV US hurt.     Component Value Date/Time   DIAGPAP  04/17/2022 0925    - Negative for intraepithelial lesion or malignancy (NILM)   DIAGPAP  06/09/2019 0851    - Negative for intraepithelial lesion or malignancy (NILM)   DIAGPAP  03/19/2017 0000    NEGATIVE FOR INTRAEPITHELIAL LESIONS OR MALIGNANCY.   HPVHIGH Positive (A) 04/17/2022 0925   HPVHIGH Negative 06/09/2019 0851   ADEQPAP  04/17/2022 0925    Satisfactory for evaluation; transformation zone component ABSENT.   ADEQPAP  06/09/2019 0851    Satisfactory for evaluation; transformation zone component PRESENT.   ADEQPAP  03/19/2017 0000    Satisfactory for evaluation  endocervical/transformation zone component PRESENT.   PCP is Dr Earl Lites  Review of Systems +pelvic pressure +bloating +moody +decrease drive +hot flashes Legs ache  Reviewed past medical,surgical, social and family history. Reviewed medications and allergies.     Objective:   Physical Exam BP 108/65 (BP Location: Right Arm, Patient Position: Sitting, Cuff Size: Normal)   Pulse (!) 57   Ht 5\' 3"  (1.6 m)   Wt 187 lb (84.8 kg)   LMP 09/11/2017   BMI 33.13 kg/m     Skin warm and dry. Lungs: clear to ausculation bilaterally. Cardiovascular: regular rate and rhythm.  Reviewed [place] with her: Normal size uterus, no masses seen, ovaries normal and EEC 3.7 mm  Upstream - 10/30/22 1102       Pregnancy Intention Screening   Does the patient want to become pregnant in the next year? N/A    Does the patient's partner want to become pregnant in the next year? N/A    Would the patient like to discuss contraceptive options today? No      Contraception Wrap Up   Current Method --   pm   End Method --   PM   Contraception Counseling Provided No              Assessment:     1. Pelvic pressure in female +pressure, like when having period  2. Bloating   3. Decreased libido No sex drive and it does not feel right when they have sex  4. Moody  5. Hot flashes Has hot flashes 1-2 an hour     Plan:    She requested PNV Meds ordered this encounter  Medications   prenatal vitamin w/FE, FA (PRENATAL 1 + 1) 27-1 MG TABS tablet    Sig: Take 1 tablet by mouth daily at 12 noon.    Dispense:  30 tablet    Refill:  12    Order Specific Question:   Supervising Provider    Answer:   Lazaro Arms [[id number]]   Will talk more after [place] read by MD Follow up TBD

## 2022-11-06 ENCOUNTER — Ambulatory Visit: Payer: BC Managed Care – PPO | Admitting: Adult Health

## 2023-01-03 HISTORY — PX: FOOT SURGERY: SHX648

## 2023-02-09 ENCOUNTER — Other Ambulatory Visit: Payer: Self-pay | Admitting: Adult Health

## 2023-02-09 MED ORDER — ORLISTAT 120 MG PO CAPS: 120.0000 mg | ORAL_CAPSULE | Freq: Three times a day (TID) | ORAL | 1 refills | Status: DC

## 2023-02-09 NOTE — Progress Notes (Signed)
 Will rx orlistat

## 2023-02-10 ENCOUNTER — Encounter: Payer: Self-pay | Admitting: *Deleted

## 2023-07-22 ENCOUNTER — Ambulatory Visit (INDEPENDENT_AMBULATORY_CARE_PROVIDER_SITE_OTHER): Admitting: Adult Health

## 2023-07-22 ENCOUNTER — Other Ambulatory Visit (HOSPITAL_COMMUNITY)
Admission: RE | Admit: 2023-07-22 | Discharge: 2023-07-22 | Disposition: A | Discharge status: Home / Self Care | Source: Ambulatory Visit | Attending: Adult Health | Admitting: Adult Health

## 2023-07-22 ENCOUNTER — Encounter: Payer: Self-pay | Admitting: Adult Health

## 2023-07-22 VITALS — BP 119/79 | HR 60 | Ht 63.0 in | Wt 179.0 lb

## 2023-07-22 DIAGNOSIS — Z124 Encounter for screening for malignant neoplasm of cervix: Secondary | ICD-10-CM | POA: Insufficient documentation

## 2023-07-22 DIAGNOSIS — R6882 Decreased libido: Secondary | ICD-10-CM

## 2023-07-22 DIAGNOSIS — R102 Pelvic and perineal pain: Secondary | ICD-10-CM

## 2023-07-22 DIAGNOSIS — R140 Abdominal distension (gaseous): Secondary | ICD-10-CM | POA: Diagnosis not present

## 2023-07-22 DIAGNOSIS — G5782 Other specified mononeuropathies of left lower limb: Secondary | ICD-10-CM

## 2023-07-22 NOTE — Progress Notes (Signed)
  Subjective:     Patient ID: Mary Gilmore, female   DOB: 1970-12-01, 53 y.o.   MRN: [id number]  HPI Dimitri is a 53 year old white female, married, PM in complaining of pelvic pressure/pain  and bloating. Has had since last year and had normal pelvic US  10/30/22. Has seen PCP for physical and labs.  She needs a pap. Pap 04/17/22 +HR HPV NILM.  PCP is Allene Ivan MD  Review of Systems + pelvic pressure/pain  and bloating   Denies any vaginal bleeding or problems with urination or BM Decreased drive, wonders about cortisol levels Reviewed past medical,surgical, social and family history. Reviewed medications and allergies.  Objective:   Physical Exam BP 119/79 (BP Location: Right Arm, Patient Position: Sitting, Cuff Size: Normal)   Pulse 60   Ht 5' 3 (1.6 m)   Wt 179 lb (81.2 kg)   BMI 31.71 kg/m     Skin warm and dry.Pelvic: external genitalia is normal in appearance no lesions, vagina is pink,urethra has no lesions or masses noted, cervix:smooth and bulbous,pap with HR HPV genotyping performed, uterus: normal size, shape and contour, non tender, no masses felt, adnexa: no masses, +tenderness noted in abdominal wall with crunch, Dr Randolm Butte in for co exam and injected with 10 cc Marcaine , and waited about 10 minutes, ?better or not. Bladder is non tender and no masses felt.   Upstream - 07/22/23 1200       Pregnancy Intention Screening   Does the patient want to become pregnant in the next year? No    Does the patient's partner want to become pregnant in the next year? No    Would the patient like to discuss contraceptive options today? No      Contraception Wrap Up   Current Method Female Sterilization    End Method Female Sterilization    Contraception Counseling Provided No    How was the end contraceptive method provided? N/A         Examination chaperoned by Lorean Rodes LPN   Assessment:     1. Routine cervical smear Pap sent Pap in 3 years if normal -  Cytology - PAP( [facility]) Had labs and physical with PCP  2. Pelvic pressure in female (Primary) +pressure  Keep 09/02/23 appt for ROS  3. Bloating Bloating at times   4. Decreased libido If wants cortisol checked, see endocrinology  5. Neuralgia of left iliohypogastric nerve Injected trigger point with Marcaine  by Dr Randolm Butte He discussed with her that the bloating was affecting the nerve See GI      Plan:     Follow up 09/02/23

## 2023-07-27 LAB — CYTOLOGY - PAP
Comment: NEGATIVE
Comment: NEGATIVE
Comment: NEGATIVE
Diagnosis: NEGATIVE
HPV 16: NEGATIVE
HPV 18 / 45: NEGATIVE
High risk HPV: POSITIVE — AB

## 2023-07-28 ENCOUNTER — Ambulatory Visit: Payer: Self-pay | Admitting: Adult Health

## 2023-07-28 DIAGNOSIS — R87810 Cervical high risk human papillomavirus (HPV) DNA test positive: Secondary | ICD-10-CM

## 2023-08-02 ENCOUNTER — Other Ambulatory Visit (HOSPITAL_COMMUNITY)
Admission: RE | Admit: 2023-08-02 | Discharge: 2023-08-02 | Disposition: A | Discharge status: Home / Self Care | Source: Ambulatory Visit | Attending: Women's Health | Admitting: Women's Health

## 2023-08-02 ENCOUNTER — Ambulatory Visit: Admitting: Women's Health

## 2023-08-02 ENCOUNTER — Encounter: Payer: Self-pay | Admitting: Women's Health

## 2023-08-02 VITALS — BP 116/69 | HR 60 | Ht 63.0 in | Wt 168.0 lb

## 2023-08-02 DIAGNOSIS — R87810 Cervical high risk human papillomavirus (HPV) DNA test positive: Secondary | ICD-10-CM | POA: Diagnosis present

## 2023-08-02 DIAGNOSIS — Z3202 Encounter for pregnancy test, result negative: Secondary | ICD-10-CM | POA: Diagnosis not present

## 2023-08-02 LAB — POCT URINE PREGNANCY: Preg Test, Ur: NEGATIVE

## 2023-08-02 NOTE — Addendum Note (Signed)
 Addended by: SANNA GONG A on: 08/02/2023 11:46 AM   Modules accepted: Orders

## 2023-08-02 NOTE — Progress Notes (Signed)
   COLPOSCOPY PROCEDURE NOTE Patient name: Mary Gilmore MRN [id number]  Date of birth: [birth year] Subjective Findings:   Mary Gilmore is a 53 y.o. [phone] Caucasian female being seen today for a colposcopy. Indication: Abnormal pap on 07/22/23: NILM w/ HRHPV positive: other (not 16, 18/45)  Prior cytology:  Date Result Procedure  04/17/22 NILM w/ HRHPV positive: other (not 16, 18/45) None  2021 NILM w/ HRHPV negative None  2019 NILM w/ HRHPV negative None      No LMP recorded. Patient has had an ablation. Contraception: post menopausal status. Menopausal: yes Hysterectomy: no.   Considering pregnancy: No New sex partner: yes The risks and benefits were explained and informed consent was obtained, and written copy is in chart. Pertinent History Reviewed:   Reviewed past medical,surgical, social, obstetrical and family history.  Reviewed problem list, medications and allergies. Objective Findings & Procedure:   Vitals:   08/02/23 1028  BP: 116/69  Pulse: 60  Weight: 168 lb (76.2 kg)  Height: 5' 3 (1.6 m)  Body mass index is 29.76 kg/m.  Results for orders placed or performed in visit on 08/02/23 (from the past 24 hours)  POCT urine pregnancy   Collection Time: 08/02/23 10:42 AM  Result Value Ref Range   Preg Test, Ur Negative Negative     Time out was performed.  Speculum placed in the vagina, cervix fully visualized. SCJ: fully visualized. Cervix swabbed x 3 with acetic acid.  Acetowhitening present: Yes Cervix: no visible lesions, no mosaicism, no punctation, no abnormal vasculature, and acetowhite lesion(s) noted at 3 o'clock. Cervical biopsies taken at 3 o'clock and Hemostasis achieved with Monsel's solution. Vagina: vaginal colposcopy not performed Vulva: vulvar colposcopy not performed  Specimens: 1  Complications: none  Chaperone: Winton Cherry  Colposcopic Impression & Plan:   Colposcopy findings consistent with LSIL Plan: Post biopsy instructions given,  Will notify patient of results when back, and Will base plan of care on pathology results and [facility]P guidelines  Return in about 1 year (around 08/01/2024) for Physical.  Suzen JONELLE Fetters CNM, WHNP-BC 08/02/2023 11:04 AM

## 2023-08-02 NOTE — Patient Instructions (Signed)
 Colposcopy, Care After  The following information offers guidance on how to care for yourself after your procedure. Your health care provider may also give you more specific instructions. If you have problems or questions, contact your health care provider. What can I expect after the procedure? If you had a colposcopy without a biopsy, you can expect to feel fine right away after your procedure. However, you may have some spotting of blood for a few days. You can return to your normal activities. If you had a colposcopy with a biopsy, it is common after the procedure to have: Soreness and mild pain. These may last for a few days. Mild vaginal bleeding or discharge that is dark-colored and grainy. This may last for a few days. The discharge may be caused by a liquid (solution) that was used during the procedure. You may need to wear a sanitary pad during this time. Spotting of blood for at least 48 hours after the procedure. Follow these instructions at home: Medicines Take over-the-counter and prescription medicines only as told by your health care provider. Talk with your health care provider about what type of over-the-counter pain medicines and prescription medicines you can start to take again. It is especially important to talk with your health care provider if you take blood thinners. Activity Avoid using douche products, using tampons, and having sex for at least 3 days after the procedure or for as long as told by your health care provider. Return to your normal activities as told by your health care provider. Ask your health care provider what activities are safe for you. General instructions Ask your health care provider if you may take baths, swim, or use a hot tub. You may take showers. If you use birth control (contraception), continue to use it. Keep all follow-up visits. This is important. Contact a health care provider if: You have a fever or chills. You faint or feel  light-headed. Get help right away if: You have heavy bleeding from your vagina or pass blood clots. Heavy bleeding is bleeding that soaks through a sanitary pad in less than 1 hour. You have vaginal discharge that is abnormal, is yellow in color, or smells bad. This could be a sign of infection. You have severe pain or cramps in your lower abdomen that do not go away with medicine. Summary If you had a colposcopy without a biopsy, you can expect to feel fine right away, but you may have some spotting of blood for a few days. You can return to your normal activities. If you had a colposcopy with a biopsy, it is common to have mild pain for a few days and spotting for 48 hours after the procedure. Avoid using douche products, using tampons, and having sex for at least 3 days after the procedure or for as long as told by your health care provider. Get help right away if you have heavy bleeding, severe pain, or signs of infection. This information is not intended to replace advice given to you by your health care provider. Make sure you discuss any questions you have with your health care provider. Document Revised: 06/16/2020 Document Reviewed: 06/16/2020 Elsevier Patient Education  2024 [employer].

## 2023-08-03 LAB — SURGICAL PATHOLOGY

## 2023-08-04 ENCOUNTER — Ambulatory Visit: Payer: Self-pay | Admitting: Women's Health

## 2023-09-02 ENCOUNTER — Encounter: Payer: Self-pay | Admitting: Adult Health

## 2023-09-02 ENCOUNTER — Encounter: Payer: Self-pay | Admitting: Women's Health

## 2023-09-02 ENCOUNTER — Ambulatory Visit: Admitting: Adult Health

## 2023-09-02 ENCOUNTER — Ambulatory Visit (INDEPENDENT_AMBULATORY_CARE_PROVIDER_SITE_OTHER): Admitting: Adult Health

## 2023-09-02 VITALS — BP 114/60 | HR 61 | Ht 63.0 in | Wt 179.0 lb

## 2023-09-02 DIAGNOSIS — R102 Pelvic and perineal pain: Secondary | ICD-10-CM | POA: Diagnosis not present

## 2023-09-02 NOTE — Progress Notes (Signed)
  Subjective:     Patient ID: Mary Gilmore, female   DOB: 04/22/1970, 53 y.o.   MRN: [id number]  HPI Mary Gilmore is a 53 year old white female,married, PM in complaining of pelvic pressure that comes and goes like a period. The Marcaine  injection that  Dr Jayne did 07/22/23 did not help.  Seeing GI now for bloating and pain right side and acid. She says can't she just have uterus taken out.     Component Value Date/Time   DIAGPAP  07/22/2023 1113    - Negative for intraepithelial lesion or malignancy (NILM)   DIAGPAP  04/17/2022 0925    - Negative for intraepithelial lesion or malignancy (NILM)   DIAGPAP  06/09/2019 0851    - Negative for intraepithelial lesion or malignancy (NILM)   HPVHIGH Positive (A) 07/22/2023 1113   HPVHIGH Positive (A) 04/17/2022 0925   HPVHIGH Negative 06/09/2019 0851   ADEQPAP  07/22/2023 1113    Satisfactory for evaluation; transformation zone component PRESENT.   ADEQPAP  04/17/2022 0925    Satisfactory for evaluation; transformation zone component ABSENT.   ADEQPAP  06/09/2019 0851    Satisfactory for evaluation; transformation zone component PRESENT.   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 Reviewed medications and allergies.  Objective:   Physical Exam BP 114/60 (BP Location: Right Arm, Patient Position: Sitting, Cuff Size: Normal)   Pulse 61   Ht 5' 3 (1.6 m)   Wt 179 lb (81.2 kg)   BMI 31.71 kg/m     Skin warm and dry. Lungs: clear to ausculation bilaterally. Cardiovascular: regular rate and rhythm.  Fall risk is low  Upstream - 09/02/23 1422       Pregnancy Intention Screening   Does the patient want to become pregnant in the next year? No    Does the patient's partner want to become pregnant in the next year? No    Would the patient like to discuss  contraceptive options today? No      Contraception Wrap Up   Current Method No Method - Other Reason   ablation; PM   End Method No Method - Other Reason   ablation;PM         Assessment:     1. Pelvic pressure in female (Primary) Goes and comes like a period  Seeing GI now, will let me know what they find     Plan:     Follow up prn
